# Patient Record
Sex: Male | Born: 1955 | Race: White | Hispanic: No | Marital: Single | Smoking: Former smoker
Health system: Southern US, Community
[De-identification: ages and names within clinical notes are randomized; demographics above are authoritative.]

---

## 2020-03-12 ENCOUNTER — Inpatient Hospital Stay (HOSPITAL_COMMUNITY)
Admission: EM | Admit: 2020-03-12 | Discharge: 2020-03-28 | DRG: 208 | Disposition: E | Payer: Medicaid Other | Attending: Internal Medicine | Admitting: Internal Medicine

## 2020-03-12 ENCOUNTER — Other Ambulatory Visit (HOSPITAL_COMMUNITY): Payer: Self-pay

## 2020-03-12 ENCOUNTER — Encounter (HOSPITAL_COMMUNITY): Payer: Self-pay | Admitting: Emergency Medicine

## 2020-03-12 ENCOUNTER — Other Ambulatory Visit: Payer: Self-pay

## 2020-03-12 ENCOUNTER — Observation Stay (HOSPITAL_COMMUNITY): Payer: Medicaid Other

## 2020-03-12 ENCOUNTER — Emergency Department (HOSPITAL_COMMUNITY): Payer: Medicaid Other

## 2020-03-12 DIAGNOSIS — R7303 Prediabetes: Secondary | ICD-10-CM | POA: Diagnosis present

## 2020-03-12 DIAGNOSIS — U071 COVID-19: Principal | ICD-10-CM | POA: Diagnosis present

## 2020-03-12 DIAGNOSIS — E871 Hypo-osmolality and hyponatremia: Secondary | ICD-10-CM | POA: Diagnosis present

## 2020-03-12 DIAGNOSIS — N179 Acute kidney failure, unspecified: Secondary | ICD-10-CM

## 2020-03-12 DIAGNOSIS — N170 Acute kidney failure with tubular necrosis: Secondary | ICD-10-CM | POA: Diagnosis present

## 2020-03-12 DIAGNOSIS — Z66 Do not resuscitate: Secondary | ICD-10-CM | POA: Diagnosis not present

## 2020-03-12 DIAGNOSIS — Z95828 Presence of other vascular implants and grafts: Secondary | ICD-10-CM

## 2020-03-12 DIAGNOSIS — R6521 Severe sepsis with septic shock: Secondary | ICD-10-CM | POA: Diagnosis not present

## 2020-03-12 DIAGNOSIS — E872 Acidosis: Secondary | ICD-10-CM | POA: Diagnosis not present

## 2020-03-12 DIAGNOSIS — Z515 Encounter for palliative care: Secondary | ICD-10-CM

## 2020-03-12 DIAGNOSIS — Z6830 Body mass index (BMI) 30.0-30.9, adult: Secondary | ICD-10-CM

## 2020-03-12 DIAGNOSIS — I10 Essential (primary) hypertension: Secondary | ICD-10-CM | POA: Diagnosis present

## 2020-03-12 DIAGNOSIS — G9341 Metabolic encephalopathy: Secondary | ICD-10-CM | POA: Diagnosis present

## 2020-03-12 DIAGNOSIS — I82462 Acute embolism and thrombosis of left calf muscular vein: Secondary | ICD-10-CM | POA: Diagnosis not present

## 2020-03-12 DIAGNOSIS — N1330 Unspecified hydronephrosis: Secondary | ICD-10-CM | POA: Diagnosis present

## 2020-03-12 DIAGNOSIS — F1010 Alcohol abuse, uncomplicated: Secondary | ICD-10-CM | POA: Diagnosis present

## 2020-03-12 DIAGNOSIS — R7402 Elevation of levels of lactic acid dehydrogenase (LDH): Secondary | ICD-10-CM | POA: Diagnosis present

## 2020-03-12 DIAGNOSIS — Z781 Physical restraint status: Secondary | ICD-10-CM

## 2020-03-12 DIAGNOSIS — Z789 Other specified health status: Secondary | ICD-10-CM

## 2020-03-12 DIAGNOSIS — C7951 Secondary malignant neoplasm of bone: Secondary | ICD-10-CM | POA: Diagnosis present

## 2020-03-12 DIAGNOSIS — R918 Other nonspecific abnormal finding of lung field: Secondary | ICD-10-CM | POA: Diagnosis present

## 2020-03-12 DIAGNOSIS — D638 Anemia in other chronic diseases classified elsewhere: Secondary | ICD-10-CM | POA: Diagnosis present

## 2020-03-12 DIAGNOSIS — J1282 Pneumonia due to coronavirus disease 2019: Secondary | ICD-10-CM | POA: Diagnosis present

## 2020-03-12 DIAGNOSIS — E669 Obesity, unspecified: Secondary | ICD-10-CM | POA: Diagnosis present

## 2020-03-12 DIAGNOSIS — I824Z9 Acute embolism and thrombosis of unspecified deep veins of unspecified distal lower extremity: Secondary | ICD-10-CM

## 2020-03-12 DIAGNOSIS — J9620 Acute and chronic respiratory failure, unspecified whether with hypoxia or hypercapnia: Secondary | ICD-10-CM

## 2020-03-12 DIAGNOSIS — Z87891 Personal history of nicotine dependence: Secondary | ICD-10-CM

## 2020-03-12 DIAGNOSIS — E870 Hyperosmolality and hypernatremia: Secondary | ICD-10-CM | POA: Diagnosis not present

## 2020-03-12 DIAGNOSIS — S37019A Minor contusion of unspecified kidney, initial encounter: Secondary | ICD-10-CM

## 2020-03-12 DIAGNOSIS — I700 Atherosclerosis of aorta: Secondary | ICD-10-CM | POA: Diagnosis present

## 2020-03-12 DIAGNOSIS — Z825 Family history of asthma and other chronic lower respiratory diseases: Secondary | ICD-10-CM

## 2020-03-12 DIAGNOSIS — J9601 Acute respiratory failure with hypoxia: Secondary | ICD-10-CM | POA: Diagnosis not present

## 2020-03-12 DIAGNOSIS — J80 Acute respiratory distress syndrome: Secondary | ICD-10-CM

## 2020-03-12 DIAGNOSIS — R0602 Shortness of breath: Secondary | ICD-10-CM

## 2020-03-12 DIAGNOSIS — A4189 Other specified sepsis: Secondary | ICD-10-CM | POA: Diagnosis not present

## 2020-03-12 DIAGNOSIS — K5720 Diverticulitis of large intestine with perforation and abscess without bleeding: Secondary | ICD-10-CM | POA: Diagnosis present

## 2020-03-12 DIAGNOSIS — Z4659 Encounter for fitting and adjustment of other gastrointestinal appliance and device: Secondary | ICD-10-CM

## 2020-03-12 DIAGNOSIS — Z978 Presence of other specified devices: Secondary | ICD-10-CM

## 2020-03-12 DIAGNOSIS — E875 Hyperkalemia: Secondary | ICD-10-CM | POA: Diagnosis not present

## 2020-03-12 DIAGNOSIS — C641 Malignant neoplasm of right kidney, except renal pelvis: Secondary | ICD-10-CM | POA: Diagnosis present

## 2020-03-12 DIAGNOSIS — I472 Ventricular tachycardia: Secondary | ICD-10-CM | POA: Diagnosis not present

## 2020-03-12 DIAGNOSIS — R0902 Hypoxemia: Secondary | ICD-10-CM

## 2020-03-12 DIAGNOSIS — Z452 Encounter for adjustment and management of vascular access device: Secondary | ICD-10-CM

## 2020-03-12 DIAGNOSIS — R310 Gross hematuria: Secondary | ICD-10-CM | POA: Diagnosis present

## 2020-03-12 LAB — BASIC METABOLIC PANEL
Anion gap: 14 (ref 5–15)
BUN: 76 mg/dL — ABNORMAL HIGH (ref 8–23)
CO2: 22 mmol/L (ref 22–32)
Calcium: 8.2 mg/dL — ABNORMAL LOW (ref 8.9–10.3)
Chloride: 100 mmol/L (ref 98–111)
Creatinine, Ser: 3.77 mg/dL — ABNORMAL HIGH (ref 0.61–1.24)
GFR, Estimated: 17 mL/min — ABNORMAL LOW (ref >60)
Glucose, Bld: 139 mg/dL — ABNORMAL HIGH (ref 70–99)
Potassium: 4.2 mmol/L (ref 3.5–5.1)
Sodium: 136 mmol/L (ref 135–145)

## 2020-03-12 LAB — COMPREHENSIVE METABOLIC PANEL
ALT: 25 U/L (ref 0–44)
AST: 39 U/L (ref 15–41)
Albumin: 2.7 g/dL — ABNORMAL LOW (ref 3.5–5.0)
Alkaline Phosphatase: 100 U/L (ref 38–126)
Anion gap: 19 — ABNORMAL HIGH (ref 5–15)
BUN: 79 mg/dL — ABNORMAL HIGH (ref 8–23)
CO2: 19 mmol/L — ABNORMAL LOW (ref 22–32)
Calcium: 8.3 mg/dL — ABNORMAL LOW (ref 8.9–10.3)
Chloride: 95 mmol/L — ABNORMAL LOW (ref 98–111)
Creatinine, Ser: 4.69 mg/dL — ABNORMAL HIGH (ref 0.61–1.24)
GFR, Estimated: 13 mL/min — ABNORMAL LOW (ref >60)
Glucose, Bld: 143 mg/dL — ABNORMAL HIGH (ref 70–99)
Potassium: 3.6 mmol/L (ref 3.5–5.1)
Sodium: 133 mmol/L — ABNORMAL LOW (ref 135–145)
Total Bilirubin: 1.6 mg/dL — ABNORMAL HIGH (ref 0.3–1.2)
Total Protein: 6.6 g/dL (ref 6.5–8.1)

## 2020-03-12 LAB — CBC WITH DIFFERENTIAL/PLATELET
Abs Immature Granulocytes: 0 10*3/uL (ref 0.00–0.07)
Basophils Absolute: 0 10*3/uL (ref 0.0–0.1)
Basophils Relative: 0 %
Eosinophils Absolute: 0 10*3/uL (ref 0.0–0.5)
Eosinophils Relative: 0 %
HCT: 38.9 % — ABNORMAL LOW (ref 39.0–52.0)
Hemoglobin: 12.8 g/dL — ABNORMAL LOW (ref 13.0–17.0)
Lymphocytes Relative: 5 %
Lymphs Abs: 0.4 10*3/uL — ABNORMAL LOW (ref 0.7–4.0)
MCH: 30 pg (ref 26.0–34.0)
MCHC: 32.9 g/dL (ref 30.0–36.0)
MCV: 91.1 fL (ref 80.0–100.0)
Monocytes Absolute: 0.2 10*3/uL (ref 0.1–1.0)
Monocytes Relative: 3 %
Neutro Abs: 6.6 10*3/uL (ref 1.7–7.7)
Neutrophils Relative %: 92 %
Platelets: 310 10*3/uL (ref 150–400)
RBC: 4.27 MIL/uL (ref 4.22–5.81)
RDW: 14.6 % (ref 11.5–15.5)
WBC: 7.2 10*3/uL (ref 4.0–10.5)
nRBC: 0.4 % — ABNORMAL HIGH (ref 0.0–0.2)
nRBC: 2 /100 WBC — ABNORMAL HIGH

## 2020-03-12 LAB — I-STAT ARTERIAL BLOOD GAS, ED
Acid-base deficit: 1 mmol/L (ref 0.0–2.0)
Bicarbonate: 22.1 mmol/L (ref 20.0–28.0)
Calcium, Ion: 1.11 mmol/L — ABNORMAL LOW (ref 1.15–1.40)
HCT: 34 % — ABNORMAL LOW (ref 39.0–52.0)
Hemoglobin: 11.6 g/dL — ABNORMAL LOW (ref 13.0–17.0)
O2 Saturation: 86 %
Potassium: 3.6 mmol/L (ref 3.5–5.1)
Sodium: 136 mmol/L (ref 135–145)
TCO2: 23 mmol/L (ref 22–32)
pCO2 arterial: 30.9 mmHg — ABNORMAL LOW (ref 32.0–48.0)
pH, Arterial: 7.464 — ABNORMAL HIGH (ref 7.350–7.450)
pO2, Arterial: 48 mmHg — ABNORMAL LOW (ref 83.0–108.0)

## 2020-03-12 LAB — C-REACTIVE PROTEIN: CRP: 15.4 mg/dL — ABNORMAL HIGH (ref <1.0)

## 2020-03-12 LAB — URINALYSIS, ROUTINE W REFLEX MICROSCOPIC
Bilirubin Urine: NEGATIVE
Glucose, UA: NEGATIVE mg/dL
Ketones, ur: NEGATIVE mg/dL
Leukocytes,Ua: NEGATIVE
Nitrite: NEGATIVE
Protein, ur: 30 mg/dL — AB
RBC / HPF: >50 RBC/hpf — ABNORMAL HIGH (ref 0–5)
Specific Gravity, Urine: 1.013 (ref 1.005–1.030)
pH: 5 (ref 5.0–8.0)

## 2020-03-12 LAB — RESP PANEL BY RT-PCR (FLU A&B, COVID) ARPGX2
Influenza A by PCR: NEGATIVE
Influenza B by PCR: NEGATIVE
SARS Coronavirus 2 by RT PCR: POSITIVE — AB

## 2020-03-12 LAB — LACTIC ACID, PLASMA
Lactic Acid, Venous: 2.2 mmol/L (ref 0.5–1.9)
Lactic Acid, Venous: 1.7 mmol/L (ref 0.5–1.9)

## 2020-03-12 LAB — TRIGLYCERIDES: Triglycerides: 315 mg/dL — ABNORMAL HIGH (ref <150)

## 2020-03-12 LAB — SODIUM, URINE, RANDOM: Sodium, Ur: 42 mmol/L

## 2020-03-12 LAB — PROTEIN / CREATININE RATIO, URINE
Creatinine, Urine: 128.44 mg/dL
Protein Creatinine Ratio: 0.6 mg/mg{Cre} — ABNORMAL HIGH (ref 0.00–0.15)
Total Protein, Urine: 77 mg/dL

## 2020-03-12 LAB — PROCALCITONIN: Procalcitonin: 1.5 ng/mL

## 2020-03-12 LAB — HIV ANTIBODY (ROUTINE TESTING W REFLEX): HIV Screen 4th Generation wRfx: NONREACTIVE

## 2020-03-12 LAB — FERRITIN: Ferritin: 958 ng/mL — ABNORMAL HIGH (ref 24–336)

## 2020-03-12 LAB — FIBRINOGEN: Fibrinogen: 720 mg/dL — ABNORMAL HIGH (ref 210–475)

## 2020-03-12 LAB — LACTATE DEHYDROGENASE: LDH: 457 U/L — ABNORMAL HIGH (ref 98–192)

## 2020-03-12 LAB — CREATININE, URINE, RANDOM: Creatinine, Urine: 129.37 mg/dL

## 2020-03-12 LAB — D-DIMER, QUANTITATIVE: D-Dimer, Quant: 2.76 ug/mL-FEU — ABNORMAL HIGH (ref 0.00–0.50)

## 2020-03-12 MED ORDER — LACTATED RINGERS IV SOLN: INTRAVENOUS | Status: DC

## 2020-03-12 MED ORDER — DEXAMETHASONE SODIUM PHOSPHATE 10 MG/ML IJ SOLN
10.0000 mg | Freq: Once | INTRAMUSCULAR | Status: AC
  Administered 2020-03-12: 15:00:00 10 mg via INTRAVENOUS
  Filled 2020-03-12: qty 1

## 2020-03-12 MED ORDER — HYDROCOD POLST-CPM POLST ER 10-8 MG/5ML PO SUER: 5.0000 mL | Freq: Two times a day (BID) | ORAL | Status: DC | PRN

## 2020-03-12 MED ORDER — GUAIFENESIN-DM 100-10 MG/5ML PO SYRP: 10.0000 mL | ORAL_SOLUTION | ORAL | Status: DC | PRN

## 2020-03-12 MED ORDER — SODIUM CHLORIDE 0.9% FLUSH
3.0000 mL | Freq: Two times a day (BID) | INTRAVENOUS | Status: DC
  Administered 2020-03-12 – 2020-03-20 (×14): 3 mL via INTRAVENOUS

## 2020-03-12 MED ORDER — POLYETHYLENE GLYCOL 3350 17 G PO PACK
17.0000 g | PACK | Freq: Every day | ORAL | Status: DC | PRN
Start: 2020-03-12 — End: 2020-03-18

## 2020-03-12 MED ORDER — SODIUM CHLORIDE 0.9 % IV SOLN
1.0000 g | INTRAVENOUS | Status: AC
  Administered 2020-03-12 – 2020-03-16 (×5): 1 g via INTRAVENOUS
  Filled 2020-03-12 (×6): qty 10

## 2020-03-12 MED ORDER — LACTATED RINGERS IV BOLUS
1000.0000 mL | Freq: Once | INTRAVENOUS | Status: AC
  Administered 2020-03-12: 18:00:00 1000 mL via INTRAVENOUS

## 2020-03-12 MED ORDER — ACETAMINOPHEN 325 MG PO TABS: 650.0000 mg | ORAL_TABLET | Freq: Four times a day (QID) | ORAL | Status: DC | PRN

## 2020-03-12 MED ORDER — HEPARIN SODIUM (PORCINE) 5000 UNIT/ML IJ SOLN
5000.0000 [IU] | Freq: Three times a day (TID) | INTRAMUSCULAR | Status: DC
  Administered 2020-03-12 – 2020-03-13 (×2): 5000 [IU] via SUBCUTANEOUS
  Filled 2020-03-12: qty 1

## 2020-03-12 MED ORDER — LACTATED RINGERS IV BOLUS
1000.0000 mL | Freq: Once | INTRAVENOUS | Status: AC
  Administered 2020-03-12: 15:00:00 1000 mL via INTRAVENOUS

## 2020-03-12 MED ORDER — AZITHROMYCIN 500 MG IV SOLR
500.0000 mg | INTRAVENOUS | Status: AC
  Administered 2020-03-12 – 2020-03-16 (×5): 500 mg via INTRAVENOUS
  Filled 2020-03-12 (×6): qty 500

## 2020-03-12 MED ORDER — BARICITINIB 2 MG PO TABS: 4.0000 mg | ORAL_TABLET | Freq: Every day | ORAL | Status: DC

## 2020-03-12 MED ORDER — DEXAMETHASONE 6 MG PO TABS
6.0000 mg | ORAL_TABLET | ORAL | Status: DC
  Filled 2020-03-12: qty 1

## 2020-03-12 MED ORDER — IPRATROPIUM-ALBUTEROL 20-100 MCG/ACT IN AERS
1.0000 | INHALATION_SPRAY | Freq: Four times a day (QID) | RESPIRATORY_TRACT | Status: DC
  Administered 2020-03-12 – 2020-03-16 (×14): 1 via RESPIRATORY_TRACT
  Filled 2020-03-12 (×2): qty 4

## 2020-03-12 NOTE — TOC Initial Note (Addendum)
Transition of Care [facility]) - Initial/Assessment Note    Patient Details  Name: Philip Stafford MRN: [id number] Date of Birth: 1955/08/26  Transition of Care [facility]) CM/SW Contact:    Verdell Carmine, RN Phone Number: 03/16/2020, 4:33 PM  Clinical Narrative:                 64 year old uninsured in with hypoxia due to the ongoing COVID 19 pandemic. Has some confusion, lives alone, girlfriend has been checking on him,has been sick for 10 days. On high flow, 60L 100% weaning in the ED as tolerated, May need ICU, will see how patient weans. May need HH likely will need Oxygen at home.  Will check with patient regarding paying source and if needs MATCH for medications, and charity items. CM will follow for needs. EDD > 3 days. Will need a FU appointment at [facility] made with assistance in finding PCP.   Expected Discharge Plan: [place] Barriers to Discharge: Continued Medical Work up   Patient Goals and CMS Choice        Expected Discharge Plan and Services Expected Discharge Plan: [place]   Discharge Planning Services: CM Consult   Living arrangements for the past 2 months: Single Family Home                                      Prior Living Arrangements/Services Living arrangements for the past 2 months: Single Family Home Lives with:: Self Patient language and need for interpreter reviewed:: Yes        Need for Family Participation in Patient Care: Yes (Comment) Care giver support system in place?: Yes (comment)   Criminal Activity/Legal Involvement Pertinent to Current Situation/Hospitalization: No - Comment as needed  Activities of Daily Living      Permission Sought/Granted                  Emotional Assessment       Orientation: : Fluctuating Orientation (Suspected and/or reported Sundowners) Alcohol / Substance Use: Not Applicable Psych Involvement: No (comment)  Admission diagnosis:  Pneumonia due to  COVID-19 virus [U07.1, J12.82] Patient Active Problem List   Diagnosis Date Noted  . Pneumonia due to COVID-19 virus 03/27/2020   PCP:  No primary care provider on file. Pharmacy:   [id number] - [place], [address] [place] [address] Phone: [phone] Fax: [phone]     Social Determinants of Health (SDOH) Interventions    Readmission Risk Interventions No flowsheet data found.

## 2020-03-12 NOTE — ED Triage Notes (Signed)
Pt arrives to triage, appearing pale and altered, unable to provide much history, O2 sats with good pleth 38% in triage. Pt does state he thinks he had a covid + test on 12/7 but unable to provide any other information at this time.

## 2020-03-12 NOTE — ED Notes (Signed)
Philip Stafford ([place]) [phone]

## 2020-03-12 NOTE — ED Notes (Signed)
Bladder scan 575ml. Patient attempting to urinate in urinal.

## 2020-03-12 NOTE — ED Notes (Signed)
Date and time results received: 03/05/2020 (use smartphrase ".now" to insert current time)  Test: lactic Critical Value: 2.2 Name of Provider Notified: [place]

## 2020-03-12 NOTE — ED Provider Notes (Signed)
[facility] EMERGENCY DEPARTMENT Provider Note   CSN: [id number] Arrival date & time: 03/21/2020  1308     History Chief Complaint  Patient presents with  . Covid Positive  . low O2 sats    Philip Stafford is a 64 y.o. male.  Patient is a 64 year old male with no significant past medical history presenting today with Covid-like symptoms.  Patient reports he started feeling sick 10 days ago where he had cough, congestion and general malaise.  He has been gradually worsening and reports he feels okay but his girlfriend has been checking on him his oxygen saturation was decreasing at home.  He denies any chest pain or abdominal pain.  Sometimes when asked a question he seems to have difficulty answering.  He does not know if he has been running fever.  He did not receive the vaccine.  He denies any recent nausea or or vomiting but has had decreased appetite.  He denies alcohol or tobacco use.  He does not use inhalers at home.  The history is provided by the patient.       History reviewed. No pertinent past medical history.  There are no problems to display for this patient.   History reviewed. No pertinent surgical history.     No family history on file.     Home Medications Prior to Admission medications   Not on File    Allergies    Patient has no known allergies.  Review of Systems   Review of Systems  All other systems reviewed and are negative.   Physical Exam Updated Vital Signs BP 132/83   Pulse 74   Temp 97.7 F (36.5 C) (Oral)   Resp (!) 22   SpO2 95%   Physical Exam Vitals and nursing note reviewed.  Constitutional:      General: He is not in acute distress.    Appearance: He is well-developed, normal weight and well-nourished.  HENT:     Head: Normocephalic and atraumatic.     Mouth/Throat:     Mouth: Oropharynx is clear and moist. Mucous membranes are dry.  Eyes:     Extraocular Movements: EOM normal.      Conjunctiva/sclera: Conjunctivae normal.     Pupils: Pupils are equal, round, and reactive to light.  Cardiovascular:     Rate and Rhythm: Normal rate and regular rhythm.     Pulses: Intact distal pulses.     Heart sounds: No murmur heard.   Pulmonary:     Effort: Pulmonary effort is normal. No respiratory distress.     Breath sounds: Rales present. No wheezing.     Comments: Fine crackles present throughout lung fields Abdominal:     General: There is no distension.     Palpations: Abdomen is soft.     Tenderness: There is no abdominal tenderness. There is no guarding or rebound.  Musculoskeletal:        General: No tenderness or edema. Normal range of motion.     Cervical back: Normal range of motion and neck supple.     Right lower leg: No edema.     Left lower leg: No edema.  Skin:    General: Skin is warm and dry.     Findings: No erythema or rash.  Neurological:     Mental Status: He is alert and oriented to person, place, and time.  Psychiatric:        Mood and Affect: Mood and affect normal.  Behavior: Behavior normal.     ED Results / Procedures / Treatments   Labs (all labs ordered are listed, but only abnormal results are displayed) Labs Reviewed  LACTIC ACID, PLASMA - Abnormal; Notable for the following components:      Result Value   Lactic Acid, Venous 2.2 (*)    All other components within normal limits  CBC WITH DIFFERENTIAL/PLATELET - Abnormal; Notable for the following components:   Hemoglobin 12.8 (*)    HCT 38.9 (*)    nRBC 0.4 (*)    All other components within normal limits  COMPREHENSIVE METABOLIC PANEL - Abnormal; Notable for the following components:   Sodium 133 (*)    Chloride 95 (*)    CO2 19 (*)    Glucose, Bld 143 (*)    BUN 79 (*)    Creatinine, Ser 4.69 (*)    Calcium 8.3 (*)    Albumin 2.7 (*)    Total Bilirubin 1.6 (*)    GFR, Estimated 13 (*)    Anion gap 19 (*)    All other components within normal limits  D-DIMER,  QUANTITATIVE (NOT AT [facility]) - Abnormal; Notable for the following components:   D-Dimer, Quant 2.76 (*)    All other components within normal limits  LACTATE DEHYDROGENASE - Abnormal; Notable for the following components:   LDH 457 (*)    All other components within normal limits  TRIGLYCERIDES - Abnormal; Notable for the following components:   Triglycerides 315 (*)    All other components within normal limits  FIBRINOGEN - Abnormal; Notable for the following components:   Fibrinogen 720 (*)    All other components within normal limits  RESP PANEL BY RT-PCR (FLU A&B, COVID) ARPGX2  CULTURE, BLOOD (ROUTINE X 2)  CULTURE, BLOOD (ROUTINE X 2)  LACTIC ACID, PLASMA  PROCALCITONIN  FERRITIN  C-REACTIVE PROTEIN    EKG EKG Interpretation  Date/Time:  Thursday March 12 2020 13:22:06 EST Ventricular Rate:  74 PR Interval:    QRS Duration: 102 QT Interval:  429 QTC Calculation: 476 R Axis:   -71 Text Interpretation: Sinus rhythm Probable left atrial enlargement RSR' in V1 or V2, probably normal variant Inferior infarct, old No previous tracing Confirmed by Blanchie Dessert [phone]) on 03/10/2020 2:23:50 PM   Radiology DG Chest Port 1 View  Result Date: 02/28/2020 CLINICAL DATA:  Shortness of breath. EXAM: PORTABLE CHEST 1 VIEW COMPARISON:  None. FINDINGS: The heart size and mediastinal contours are within normal limits. Diffuse interstitial thickening. More ill-defined perihilar airspace opacity in the left upper lobe. No pleural effusion or pneumothorax. No acute osseous abnormality. IMPRESSION: 1. Left upper lobe airspace disease concerning for pneumonia. 2. Bilateral diffuse interstitial thickening may represent interstitial edema versus interstitial pneumonitis secondary to an infectious or inflammatory etiology. Electronically Signed   By: Titus Dubin M.D.   On: 02/28/2020 13:53    Procedures Procedures (including critical care time)  Medications Ordered in  ED Medications - No data to display  ED Course  I have reviewed the triage vital signs and the nursing notes.  Pertinent labs & imaging results that were available during my care of the patient were reviewed by me and considered in my medical decision making (see chart for details).    MDM Rules/Calculators/A&P                          Patient presenting with symptoms today most classic for Covid pneumonia.  He is hypoxic but otherwise in no acute distress.  Patient does appear mildly confused and has sometimes difficulty answering questions.  Initially on evaluation to triage oxygen saturation was 38% however patient is currently on a nonrebreather and O2 sats are 95%.  He denies any chest pain or abdominal pain low suspicion for PE at this time.  Patient has not received the vaccine and denies any medication allergies.  He does not take any medications regularly.  Blood pressure and heart rate are stable.  Preadmission Covid labs in process.  2:50 PM Patient's lactate is elevated at 2.2 and he has a new acute kidney injury with creatinine of 4.69 and anion gap of 19.  BUN is elevated at 79 as well and suspect prerenal azotemia.  D-dimer is elevated 2.76, LDH elevated at 457 and also elevated fibrinogen and triglycerides.  Chest x-ray is consistent with Covid pneumonia.  Patient given a liter of fluid.  Patient remains on a nonrebreather and saturations in the high 80s.  Will switch to high flow nasal cannula.  Will admit for further care.  MDM Number of Diagnoses or Management Options   Amount and/or Complexity of Data Reviewed Clinical lab tests: ordered and reviewed Tests in the radiology section of CPT: ordered and reviewed Tests in the medicine section of CPT: ordered and reviewed Decide to obtain previous medical records or to obtain history from someone other than the patient: yes Obtain history from someone other than the patient: yes Discuss the patient with other providers:  yes Independent visualization of images, tracings, or specimens: yes  Risk of Complications, Morbidity, and/or Mortality Presenting problems: high Diagnostic procedures: moderate Management options: moderate  Patient Progress Patient progress: stable  CRITICAL CARE Performed by: Zachary Lovins Total critical care time: 30 minutes Critical care time was exclusive of separately billable procedures and treating other patients. Critical care was necessary to treat or prevent imminent or life-threatening deterioration. Critical care was time spent personally by me on the following activities: development of treatment plan with patient and/or surrogate as well as nursing, discussions with consultants, evaluation of patient's response to treatment, examination of patient, obtaining history from patient or surrogate, ordering and performing treatments and interventions, ordering and review of laboratory studies, ordering and review of radiographic studies, pulse oximetry and re-evaluation of patient's condition.  Philip Stafford was evaluated in Emergency Department on 03/05/2020 for the symptoms described in the history of present illness. He was evaluated in the context of the global COVID-19 pandemic, which necessitated consideration that the patient might be at risk for infection with the SARS-CoV-2 virus that causes COVID-19. Institutional protocols and algorithms that pertain to the evaluation of patients at risk for COVID-19 are in a state of rapid change based on information released by regulatory bodies including the CDC and federal and state organizations. These policies and algorithms were followed during the patient's care in the ED.  Final Clinical Impression(s) / ED Diagnoses Final diagnoses:  Pneumonia due to COVID-19 virus  Acute respiratory failure with hypoxia ([place])  AKI (acute kidney injury) [facility])    Rx / DC Orders ED Discharge Orders    None       Blanchie Dessert,  MD 03/27/2020 1452

## 2020-03-12 NOTE — H&P (Signed)
Date: 03/21/2020               Patient Name:  Philip Stafford MRN: [id number]  DOB: 18-Jan-1956 Age / Sex: 64 y.o., male   PCP: Patient, No Pcp Per         Medical Service: Internal Medicine Teaching Service         Attending Physician: Dr. Velna Ochs    First Contact: Dr. Konrad Penta Pager: [phone]  Second Contact: Dr. Charleen Kirks Pager: [phone]       After Hours (After 5p/  First Contact Pager: [phone]  weekends / holidays): Second Contact Pager: [phone]   Chief Complaint: Fever  History of Present Illness:   Philip Stafford is a 64 y.o. gentleman with no known PMHx who presented to the ED with c/o fever.   Philip Stafford states his first symptom was fever, approximately 102.1 occurred approximately 10 days ago.  In this time, he had also developed productive cough with occasionally small bloody streaks in his sputum.  He denies any difficulty breathing though.  He is also experiencing nausea when eating with approximately 1-2 episodes of nonbilious, nonbloody emesis.  He endorses diaphoresis, dizziness, blurry vision, generalized weakness, fatigue that all started since he became sick, but denies any chest pain, palpitations, abdominal pain, dysuria, hematuria, urinary frequency or urgency.  He endorses some back pain but feels it is muscular.  He notes that he came to the ED as his girlfriend has been checking his oxygen saturations at home and he was noted to be D satting.  He went to an urgent care on the December 15th, where he was prescribed azithromycin, decadron, tessalon pearls, hydroxyurea )for GI upset) and folic acid.   ED Course:  On arrival to the ED private car, initial oxygen saturation noted in triage was 38% that improved to 95% on nonrebreather.  Patient was afebrile at 97.7, normotensive at 132/83, heart rate of 74 with a respiratory rate of 22.  Initial CMP remarkable for acute renal failure with creatinine of 4.69, BUN of 79.  He also has a anion gap acidosis with  CO2 of 19 and anion gap of 19, mild hyponatremia at 133, mildly elevated total bilirubin of 1.6.  CBC shows a mild normocytic anemia with hemoglobin of 12.8.  No leukocytosis. Inflammatory markers are elevated with LDH of 457, ferritin of 958, CRP of 15, fibrinogen of 720, D-dimer of 2.76.  Initial chest x-ray with left upper lobe airspace disease concerning for pneumonia, as well as bilateral diffuse interstitial thickening.  Covid test was obtained and positive.  Home Medications: None   Allergies: Allergies as of 03/21/2020  . (No Known Allergies)   History reviewed. No pertinent past medical history.  Family History: Father: COPD Brother: COPD  Mother: Heart disease, passed in 91  Social History:  Lives in [place], [place] Works as a Dealer  Former tobacco use. 2 packs per day for 35 years. Quit 11 years ago Denies current alcohol use. Endorses previously heavy use, quit 11 years ago Denies current drug use. Previously smoked marijuana.   Went to prison in 2010 for 2 years. Since then, he has not used any tobacco, alcohol or drug products.   Review of Systems: A complete ROS was negative except as per HPI.   Physical Exam: Blood pressure (!) 133/92, pulse 73, temperature 97.7 F (36.5 C), temperature source Oral, resp. rate 20, SpO2 91 %.  Physical Exam Vitals and nursing note reviewed.  Constitutional:  General: He is not in acute distress.    Appearance: He is obese. He is ill-appearing. He is not diaphoretic.  Eyes:     General: Scleral icterus present.  Cardiovascular:     Rate and Rhythm: Normal rate and regular rhythm.     Heart sounds: No murmur heard. No gallop.   Pulmonary:     Effort: Tachypnea present. No accessory muscle usage or respiratory distress.     Breath sounds: Rales (Inspiratory Rales noted in all lung fields) present. No decreased breath sounds, wheezing or rhonchi.  Abdominal:     General: Bowel sounds are normal. There is distension  (Possibly due to body habitus).     Palpations: Abdomen is soft. There is no mass.     Tenderness: There is no abdominal tenderness. There is no right CVA tenderness, left CVA tenderness or guarding.  Musculoskeletal:     Right lower leg: No edema.     Left lower leg: No edema.  Skin:    General: Skin is warm and dry.     Coloration: Skin is not jaundiced.  Neurological:     General: No focal deficit present.     Mental Status: He is alert and oriented to person, place, and time.     Comments: Answer some questions slowly, however he is oriented x3 and answers all questions appropriately  Psychiatric:        Mood and Affect: Mood normal.        Behavior: Behavior normal.    EKG: personally reviewed my interpretation is NSR at 74bpm. No significant T wave inversions or ST segment elevations or depressions to suggest ischemia.   CXR: personally reviewed my interpretation is diffuse, bilateral interstitial edema, worse in the LUL and RML.    Assessment & Plan by Problem: Active Problems:   Pneumonia due to COVID-19 virus  # COVID-19 Pneumonia  Patient reports approximately 10 days of symptoms with initial positive test today.  Due to this, he is out of the window for remdesivir and unfortunately his renal failure limits the use of baricitinib.  Current COVID-19 management with Decadron only. Given an opacity localized to the LUL, procalcitonin checked and elevated at 1.50. Will start empiric treatment for CAP as well.   He is at have very high risk of decompensation given currently saturating between 90 to 92% on 40 L high flow nasal cannula with 100% FiO2; easily desats with any motion and takes a while to recover.   - Supplemental O2 to maintain oxygen saturation > 90% - Decadron 10 mg received - Start Decadron 6 mg daily tomorrow for 9 days to complete 10 day course - Consider Baricitinib should renal function recover - Ceftriaxone 1 g IV daily  - Azithromycin 500 mg IV daily  -  ABG pending - Lactic acid pending  - Trend inflammatory markers daily  - Low threshold to contact PCCM for possible transfer to ICU  # Acute Renal Failure  Chronicity unknown but suspect an aspect of CKD given profoundly elevated creatinine and BUN.  I suspect that there is a portion of this that is prerenal given acute illness for the past 10 days.  Renal ultrasound obtained that showed irregular density along the periphery of the right kidney which may represent a subcapsular hematoma, infection or neoplasm.  Non-conrast CT recommended and ordered.   No profound encephalopathy at this time, although patient is slow to answer questions.  ABG still pending, however CO2 is mildly low at 19.  No hyperkalemia, profound acidosis or volume overload to suggest emergent need for dialysis.   Bladder scan at ~ 500 mL. Will in/out PRN.   - Consider consult to Nephrology  - Bladder scan q8h  - In/out cath PRN  - Urinalysis  - Urine sodium, creatinine, protein/creatinine ratio  - CT abdomen/pelvis WO contrast  - s/p 1 L of LR. Additional 1 L followed by 125 cc/hr maintenance fluids   # Anion Gap Metabolic Acidosis  Anion gap of 19 with bicarb of 19.  Initial lactic acid only mildly elevated, so likely secondary to uremia.    - Repeat BMP pending   # Hyperglycemia  This may be elevated in the setting of acute illness versus underlying undiagnosed diabetes   - A1c pending    Dispo: Admit patient to Observation with expected length of stay less than 2 midnights.  Signed: Dr. Jose Persia Internal Medicine PGY-2  Pager: [phone] After 5pm on weekdays and 1pm on weekends: On Call pager [phone]  03/20/2020, 5:56 PM

## 2020-03-12 NOTE — Progress Notes (Signed)
Patient placed on heated high flow per MD. Patient on 40L and 100 % with sats of 89%. RT will wean oxygen.

## 2020-03-12 NOTE — Consult Note (Signed)
NAME:  Philip Stafford, MRN:  [id number], DOB:  1956/01/31, LOS: 0 ADMISSION DATE:  03/27/2020, CONSULTATION DATE:  03/17/2020 REFERRING MD:  Charleen Kirks, CHIEF COMPLAINT:  Hypoxia   Brief History:  64 year old male with history of tobacco use and minimal interaction with the healthcare system who presented to the ED with fever and shortness of breath.  Found to be Covid positive and was requiring high flow nasal cannula with concern for worsening respiratory deterioration.    History of Present Illness:  Philip Stafford is a 64 year old male who does not follow with primary care and has a history of 70 pack years of tobacco use  (quit 11 yrs ago) who presents with fever up to 102F at home.  He had been diagnosed with Covid-19 approximately 4 days ago.  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  He has had some cough and emesis.  He was prescribed azithromycin by urgent care.  On arrival to the ED his oxygen saturations were very low, 38% though patient was awake so unlikely to be accurate.  He was placed on nonrebreather.  Work-up was significant for likely left upper lobe pneumonia and diffuse interstitial thickening with elevated inflammatory markers and a creatinine of 4.6.  Oxygen requirements increased to 40 L with PO2 of 48 on ABG, so PCCM was consulted.  On evaluation, reportedly patient's oxygen tank had run out at the time of a prior ABG.  He is currently awake and alert on 15 L nonrebreather without any respiratory distress or complaints.  Past Medical History:  Tobacco use  Significant Hospital Events:  12/16 admit to internal medicine  Consults:  PCCM  Procedures:    Significant Diagnostic Tests:    Micro Data:  12/16 Covid-19>> positive  Antimicrobials:   azithromycin 12/16- Ceftriaxone 12/16-  Interim History / Subjective:  As above  Objective   Blood pressure (!) 147/90, pulse 71, temperature 97.7 F (36.5 C), temperature source  Oral, resp. rate (!) 23, SpO2 95 %.    FiO2 (%):  [100 %] 100 %   Intake/Output Summary (Last 24 hours) at 03/16/2020 2055 Last data filed at 03/06/2020 2041 Gross per 24 hour  Intake 2000 ml  Output 700 ml  Net 1300 ml   There were no vitals filed for this visit.  General: Elderly male, resting in bed, no distress HEENT: MM pink/moist Neuro: Awake, alert and oriented, following commands CV: s1s2 RRR, no m/r/g PULM: No tachypnea or accessory muscle use, mild crackles bilateral bases without wheezing, saturations > 95% on 15 L GI: soft, bsx4 active  Extremities: warm/dry,  No edema  Skin: no rashes or lesions   Resolved Hospital Problem list     Assessment & Plan:   Acute hypoxic respiratory failure secondary to Covid-19 pneumonia Patient had worsening hypoxia earlier, improved with changing out oxygen tank.  She really at high risk of decompensation giving significant history of tobacco use and unvaccinated status.  But currently stable and does not require BiPAP or ICU care P -Agree with primary team's plan of supplemental oxygen as needed to maintain saturations greater than 88% -Steroids, azithromycin and ceftriaxone, consider baricitinib -Please reengage PCCM for any concerns of worsening respiratory failure or overall clinical status    Goals of Care:  Last date of multidisciplinary goals of care discussion: N/A Family and staff present: N/A Summary of discussion: N/A Follow up goals of care discussion due: N/A Code Status: Full code  Labs  CBC: Recent Labs  Lab 03/16/2020 1336 03/25/2020 1831  WBC 7.2  --   NEUTROABS 6.6  --   HGB 12.8* 11.6*  HCT 38.9* 34.0*  MCV 91.1  --   PLT 310  --     Basic Metabolic Panel: Recent Labs  Lab 03/11/2020 1336 03/09/2020 1831  NA 133* 136  K 3.6 3.6  CL 95*  --   CO2 19*  --   GLUCOSE 143*  --   BUN 79*  --   CREATININE 4.69*  --   CALCIUM 8.3*  --    GFR: CrCl cannot be calculated (Unknown ideal  weight.). Recent Labs  Lab 03/09/2020 1336 03/15/2020 1636  PROCALCITON 1.50  --   WBC 7.2  --   LATICACIDVEN 2.2* 1.7    Liver Function Tests: Recent Labs  Lab 03/17/2020 1336  AST 39  ALT 25  ALKPHOS 100  BILITOT 1.6*  PROT 6.6  ALBUMIN 2.7*   No results for input(s): LIPASE, AMYLASE in the last 168 hours. No results for input(s): AMMONIA in the last 168 hours.  ABG    Component Value Date/Time   PHART 7.464 (H) 03/23/2020 1831   PCO2ART 30.9 (L) 03/08/2020 1831   PO2ART 48 (L)  1831   HCO3 22.1 03/16/2020 1831   TCO2 23 03/21/2020 1831   ACIDBASEDEF 1.0 02/29/2020 1831   O2SAT 86.0 03/04/2020 1831     Coagulation Profile: No results for input(s): INR, PROTIME in the last 168 hours.  Cardiac Enzymes: No results for input(s): CKTOTAL, CKMB, CKMBINDEX, TROPONINI in the last 168 hours.  HbA1C: No results found for: HGBA1C  CBG: No results for input(s): GLUCAP in the last 168 hours.  Review of Systems:   Negative except as noted in HPI  Past Medical History:  He,  has no past medical history on file.   Surgical History:  History reviewed. No pertinent surgical history.   Social History:    Denies alcohol use or drug use  Family History:  His family history is not on file.   Allergies No Known Allergies   Home Medications  Prior to Admission medications   Medication Sig Start Date End Date Taking? Authorizing Provider  azithromycin (ZITHROMAX) 250 MG tablet Take 250-500 mg by mouth as directed. Take 500mg  on day one, then 250mg  on days 2 through 5.   Yes [provider]  benzonatate (TESSALON) 100 MG capsule Take 100 mg by mouth 3 (three) times daily.   Yes [provider]  dexamethasone (DECADRON) 2 MG tablet Take 2-4 mg by mouth See admin instructions. Take 4mg  daily for 3 days, then 2mg  daily for 2 days.   Yes [provider]  folic acid (FOLVITE) 1 MG tablet Take 2 mg by mouth daily. Take for 5 days.   Yes  [provider]  hydroxyurea (HYDREA) 500 MG capsule Take 500 mg by mouth 2 (two) times daily. Take for 5 days. May take with food to minimize GI side effects.   Yes [provider]     Critical care time: 35 minutes    CRITICAL CARE Performed by: Otilio Carpen Keats Kingry   Total critical care time: 35 minutes  Critical care time was exclusive of separately billable procedures and treating other patients.  Critical care was necessary to treat or prevent imminent or life-threatening deterioration.  Critical care was time spent personally by me on the following activities: development of treatment plan with patient and/or surrogate as well as nursing,  discussions with consultants, evaluation of patient's response to treatment, examination of patient, obtaining history from patient or surrogate, ordering and performing treatments and interventions, ordering and review of laboratory studies, ordering and review of radiographic studies, pulse oximetry and re-evaluation of patient's condition.   Otilio Carpen Nataki Mccrumb, PA-C [place] [facility]  Pager# [phone], if no answer [phone]

## 2020-03-13 ENCOUNTER — Observation Stay (HOSPITAL_COMMUNITY): Payer: Medicaid Other

## 2020-03-13 ENCOUNTER — Observation Stay (HOSPITAL_BASED_OUTPATIENT_CLINIC_OR_DEPARTMENT_OTHER): Payer: Medicaid Other

## 2020-03-13 DIAGNOSIS — E669 Obesity, unspecified: Secondary | ICD-10-CM | POA: Diagnosis present

## 2020-03-13 DIAGNOSIS — R310 Gross hematuria: Secondary | ICD-10-CM | POA: Diagnosis present

## 2020-03-13 DIAGNOSIS — R6521 Severe sepsis with septic shock: Secondary | ICD-10-CM | POA: Diagnosis not present

## 2020-03-13 DIAGNOSIS — J80 Acute respiratory distress syndrome: Secondary | ICD-10-CM | POA: Diagnosis present

## 2020-03-13 DIAGNOSIS — N1330 Unspecified hydronephrosis: Secondary | ICD-10-CM | POA: Diagnosis present

## 2020-03-13 DIAGNOSIS — C7951 Secondary malignant neoplasm of bone: Secondary | ICD-10-CM | POA: Diagnosis present

## 2020-03-13 DIAGNOSIS — G9340 Encephalopathy, unspecified: Secondary | ICD-10-CM | POA: Diagnosis not present

## 2020-03-13 DIAGNOSIS — N179 Acute kidney failure, unspecified: Secondary | ICD-10-CM | POA: Diagnosis not present

## 2020-03-13 DIAGNOSIS — J1282 Pneumonia due to coronavirus disease 2019: Secondary | ICD-10-CM | POA: Diagnosis present

## 2020-03-13 DIAGNOSIS — E871 Hypo-osmolality and hyponatremia: Secondary | ICD-10-CM | POA: Diagnosis present

## 2020-03-13 DIAGNOSIS — Z452 Encounter for adjustment and management of vascular access device: Secondary | ICD-10-CM | POA: Diagnosis not present

## 2020-03-13 DIAGNOSIS — S37019A Minor contusion of unspecified kidney, initial encounter: Secondary | ICD-10-CM

## 2020-03-13 DIAGNOSIS — D638 Anemia in other chronic diseases classified elsewhere: Secondary | ICD-10-CM | POA: Diagnosis present

## 2020-03-13 DIAGNOSIS — R7989 Other specified abnormal findings of blood chemistry: Secondary | ICD-10-CM | POA: Diagnosis not present

## 2020-03-13 DIAGNOSIS — E870 Hyperosmolality and hypernatremia: Secondary | ICD-10-CM | POA: Diagnosis not present

## 2020-03-13 DIAGNOSIS — S37011A Minor contusion of right kidney, initial encounter: Secondary | ICD-10-CM | POA: Diagnosis not present

## 2020-03-13 DIAGNOSIS — U071 COVID-19: Secondary | ICD-10-CM | POA: Diagnosis present

## 2020-03-13 DIAGNOSIS — J9601 Acute respiratory failure with hypoxia: Secondary | ICD-10-CM | POA: Diagnosis present

## 2020-03-13 DIAGNOSIS — J9621 Acute and chronic respiratory failure with hypoxia: Secondary | ICD-10-CM | POA: Diagnosis not present

## 2020-03-13 DIAGNOSIS — I82462 Acute embolism and thrombosis of left calf muscular vein: Secondary | ICD-10-CM | POA: Diagnosis not present

## 2020-03-13 DIAGNOSIS — Z515 Encounter for palliative care: Secondary | ICD-10-CM | POA: Diagnosis not present

## 2020-03-13 DIAGNOSIS — Z66 Do not resuscitate: Secondary | ICD-10-CM | POA: Diagnosis not present

## 2020-03-13 DIAGNOSIS — I824Z9 Acute embolism and thrombosis of unspecified deep veins of unspecified distal lower extremity: Secondary | ICD-10-CM | POA: Diagnosis not present

## 2020-03-13 DIAGNOSIS — I700 Atherosclerosis of aorta: Secondary | ICD-10-CM | POA: Diagnosis present

## 2020-03-13 DIAGNOSIS — N170 Acute kidney failure with tubular necrosis: Secondary | ICD-10-CM | POA: Diagnosis present

## 2020-03-13 DIAGNOSIS — K5720 Diverticulitis of large intestine with perforation and abscess without bleeding: Secondary | ICD-10-CM | POA: Diagnosis present

## 2020-03-13 DIAGNOSIS — R918 Other nonspecific abnormal finding of lung field: Secondary | ICD-10-CM | POA: Diagnosis present

## 2020-03-13 DIAGNOSIS — R7402 Elevation of levels of lactic acid dehydrogenase (LDH): Secondary | ICD-10-CM | POA: Diagnosis present

## 2020-03-13 DIAGNOSIS — A4189 Other specified sepsis: Secondary | ICD-10-CM | POA: Diagnosis not present

## 2020-03-13 DIAGNOSIS — I82402 Acute embolism and thrombosis of unspecified deep veins of left lower extremity: Secondary | ICD-10-CM | POA: Diagnosis not present

## 2020-03-13 DIAGNOSIS — E872 Acidosis: Secondary | ICD-10-CM | POA: Diagnosis not present

## 2020-03-13 DIAGNOSIS — G9341 Metabolic encephalopathy: Secondary | ICD-10-CM | POA: Diagnosis present

## 2020-03-13 DIAGNOSIS — J9620 Acute and chronic respiratory failure, unspecified whether with hypoxia or hypercapnia: Secondary | ICD-10-CM | POA: Diagnosis not present

## 2020-03-13 DIAGNOSIS — C641 Malignant neoplasm of right kidney, except renal pelvis: Secondary | ICD-10-CM | POA: Diagnosis present

## 2020-03-13 DIAGNOSIS — R7303 Prediabetes: Secondary | ICD-10-CM | POA: Diagnosis present

## 2020-03-13 LAB — CBC WITH DIFFERENTIAL/PLATELET
Abs Immature Granulocytes: 0 10*3/uL (ref 0.00–0.07)
Basophils Absolute: 0 10*3/uL (ref 0.0–0.1)
Basophils Relative: 0 %
Eosinophils Absolute: 0 10*3/uL (ref 0.0–0.5)
Eosinophils Relative: 0 %
HCT: 34.5 % — ABNORMAL LOW (ref 39.0–52.0)
Hemoglobin: 11.6 g/dL — ABNORMAL LOW (ref 13.0–17.0)
Lymphocytes Relative: 4 %
Lymphs Abs: 0.4 10*3/uL — ABNORMAL LOW (ref 0.7–4.0)
MCH: 30.2 pg (ref 26.0–34.0)
MCHC: 33.6 g/dL (ref 30.0–36.0)
MCV: 89.8 fL (ref 80.0–100.0)
Monocytes Absolute: 0.4 10*3/uL (ref 0.1–1.0)
Monocytes Relative: 4 %
Neutro Abs: 10.2 10*3/uL — ABNORMAL HIGH (ref 1.7–7.7)
Neutrophils Relative %: 92 %
Platelets: 335 10*3/uL (ref 150–400)
RBC: 3.84 MIL/uL — ABNORMAL LOW (ref 4.22–5.81)
RDW: 14.8 % (ref 11.5–15.5)
WBC: 11.1 10*3/uL — ABNORMAL HIGH (ref 4.0–10.5)
nRBC: 0.4 % — ABNORMAL HIGH (ref 0.0–0.2)
nRBC: 1 /100 WBC — ABNORMAL HIGH

## 2020-03-13 LAB — COMPREHENSIVE METABOLIC PANEL
ALT: 22 U/L (ref 0–44)
AST: 36 U/L (ref 15–41)
Albumin: 2.4 g/dL — ABNORMAL LOW (ref 3.5–5.0)
Alkaline Phosphatase: 85 U/L (ref 38–126)
Anion gap: 16 — ABNORMAL HIGH (ref 5–15)
BUN: 75 mg/dL — ABNORMAL HIGH (ref 8–23)
CO2: 21 mmol/L — ABNORMAL LOW (ref 22–32)
Calcium: 8.4 mg/dL — ABNORMAL LOW (ref 8.9–10.3)
Chloride: 102 mmol/L (ref 98–111)
Creatinine, Ser: 3.27 mg/dL — ABNORMAL HIGH (ref 0.61–1.24)
GFR, Estimated: 20 mL/min — ABNORMAL LOW (ref >60)
Glucose, Bld: 173 mg/dL — ABNORMAL HIGH (ref 70–99)
Potassium: 3.8 mmol/L (ref 3.5–5.1)
Sodium: 139 mmol/L (ref 135–145)
Total Bilirubin: 0.8 mg/dL (ref 0.3–1.2)
Total Protein: 5.8 g/dL — ABNORMAL LOW (ref 6.5–8.1)

## 2020-03-13 LAB — BLOOD GAS, ARTERIAL
Acid-base deficit: 2 mmol/L (ref 0.0–2.0)
Bicarbonate: 21.6 mmol/L (ref 20.0–28.0)
Drawn by: 535211
FIO2: 100
O2 Saturation: 82 %
Patient temperature: 36.7
pCO2 arterial: 32.7 mmHg (ref 32.0–48.0)
pH, Arterial: 7.435 (ref 7.350–7.450)
pO2, Arterial: 48.2 mmHg — ABNORMAL LOW (ref 83.0–108.0)

## 2020-03-13 LAB — ECHOCARDIOGRAM LIMITED
Area-P 1/2: 4.89 cm2
Calc EF: 62.5 %
S' Lateral: 3.4 cm
Single Plane A2C EF: 61.3 %
Single Plane A4C EF: 64 %

## 2020-03-13 LAB — D-DIMER, QUANTITATIVE: D-Dimer, Quant: 3.91 ug/mL-FEU — ABNORMAL HIGH (ref 0.00–0.50)

## 2020-03-13 LAB — GLUCOSE, CAPILLARY: Glucose-Capillary: 166 mg/dL — ABNORMAL HIGH (ref 70–99)

## 2020-03-13 LAB — HEMOGLOBIN A1C
Hgb A1c MFr Bld: 5.9 % — ABNORMAL HIGH (ref 4.8–5.6)
Mean Plasma Glucose: 122.63 mg/dL

## 2020-03-13 LAB — FERRITIN: Ferritin: 1157 ng/mL — ABNORMAL HIGH (ref 24–336)

## 2020-03-13 LAB — C-REACTIVE PROTEIN: CRP: 7.1 mg/dL — ABNORMAL HIGH (ref <1.0)

## 2020-03-13 MED ORDER — DEXAMETHASONE SODIUM PHOSPHATE 10 MG/ML IJ SOLN
6.0000 mg | INTRAMUSCULAR | Status: DC
  Administered 2020-03-13 – 2020-03-20 (×8): 6 mg via INTRAVENOUS
  Filled 2020-03-13 (×8): qty 1

## 2020-03-13 MED ORDER — CHLORHEXIDINE GLUCONATE CLOTH 2 % EX PADS
6.0000 | MEDICATED_PAD | Freq: Every day | CUTANEOUS | Status: DC
  Administered 2020-03-13 – 2020-03-21 (×9): 6 via TOPICAL

## 2020-03-13 NOTE — Progress Notes (Signed)
RT NOTE: RT arrived to patient room and patient was sating 88-90% on 15L [place] and NRB. RT placed patient on 20L HHFNC per MD order and patients sats would not go above 85%. RT added NRB with HHFNC and sats came up to 88%. RT increased HHFNC to 25L and sats are now 90%-92%. Patient states that his breathing feels comfortable on HHFNC and NRB. RN aware and at bedside.  RT will continue to monitor.

## 2020-03-13 NOTE — Consult Note (Signed)
Reason for Consult: Acute kidney injury, right perinephric collection with mass-effect Referring Physician: Velna Ochs MD (MTS)  HPI:  64 year old man with unremarkable past medical history/no regular medical follow-up who presented to the emergency room with complaints of a 10-day history of generalized weakness/fatigue, fever along with productive cough and occasional hemoptysis.  He was found to have intermittent oxygen desaturation on home pulse ox monitoring and upon arrival to the emergency room he was noted to be significantly hypoxic at 38% improving to 95% on NRB.  He was consequently found to be positive for COVID-19 infection and is unvaccinated.  He reports about 2 episodes of nausea and vomiting prior to his admission but denies any diarrhea or abdominal pain.  He denies any chest pain or pedal edema.  Concern raised with his renal function on admission with an elevated creatinine of 4.7 that has improved to 3.3 with ongoing supportive management.  He had a renal ultrasound showing an irregular density at the periphery of the right kidney with differential was raised for subcapsular hematoma, infected collection versus neoplasm and this was followed up with CT scan of the abdomen/pelvis without contrast showing an asymmetrically right enlarged kidney with moderate to large perinephric collection suggestive of hematoma (evaluation limited by lack of contrast).  Enlargement is associated with what appears to be mass-effect on the parenchyma of the kidney and mild hydronephrosis.  Urinalysis done yesterday showed pH of 5.0, 30 protein, rare bacteria, 0-5 WBC/hpf with >50 RBC/hpf. FeNa 1.1%.  Urine PC ratio 0.6.  He denies any dysuria, urgency, frequency, flank pain, fever, chills or hematuria prior to admission.  History reviewed. No pertinent past medical history.  He denies any past history of kidney disease and does not have any strong family history of renal disease or  personal/familial history of autoimmune disorders such as lupus.  History reviewed. No pertinent surgical history.  History reviewed. No pertinent family history.  Social History:  reports that he has quit smoking. He has never used smokeless tobacco. No history on file for alcohol use and drug use.  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  He denies any ongoing tobacco use or illicit drug use.  Allergies: No Known Allergies  Medications:  Scheduled: . Chlorhexidine Gluconate Cloth  6 each Topical Daily  . dexamethasone  6 mg Oral Q24H  . Ipratropium-Albuterol  1 puff Inhalation Q6H  . sodium chloride flush  3 mL Intravenous Q12H    BMP Latest Ref Rng & Units 03/13/2020 03/01/2020 03/15/2020  Glucose 70 - 99 mg/dL 173(H) 139(H) -  BUN 8 - 23 mg/dL 75(H) 76(H) -  Creatinine 0.61 - 1.24 mg/dL 3.27(H) 3.77(H) -  Sodium 135 - 145 mmol/L 139 136 136  Potassium 3.5 - 5.1 mmol/L 3.8 4.2 3.6  Chloride 98 - 111 mmol/L 102 100 -  CO2 22 - 32 mmol/L 21(L) 22 -  Calcium 8.9 - 10.3 mg/dL 8.4(L) 8.2(L) -   CBC Latest Ref Rng & Units 03/13/2020 03/07/2020 03/14/2020  WBC 4.0 - 10.5 K/uL 11.1(H) - 7.2  Hemoglobin 13.0 - 17.0 g/dL 11.6(L) 11.6(L) 12.8(L)  Hematocrit 39.0 - 52.0 % 34.5(L) 34.0(L) 38.9(L)  Platelets 150 - 400 K/uL 335 - 310     CT ABDOMEN PELVIS WO CONTRAST  Addendum Date: 03/06/2020   ADDENDUM REPORT: 03/17/2020 21:51 ADDENDUM: Other differential for right perinephric collection as it aerated in the previously performed ultrasound. Ultimately a contrast enhanced examination may be required for further evaluation when clinically feasible.  Electronically Signed   By: Donavan Foil M.D.   On: 02/27/2020 21:51   Result Date: 03/11/2020 CLINICAL DATA:  Renal failure new acute renal failure EXAM: CT ABDOMEN AND PELVIS WITHOUT CONTRAST TECHNIQUE: Multidetector CT imaging of the abdomen and pelvis was performed following the standard protocol without IV contrast.  COMPARISON:  Ultrasound 03/16/2020 FINDINGS: Lower chest: Lung bases demonstrate extensive ground-glass density. 12 mm left lower lobe lung nodule, series 5, image number 5. 9 mm right lower lobe lung nodule, series 5, image number 9. No pleural effusion. Normal cardiac size Hepatobiliary: No focal liver abnormality is seen. No gallstones, gallbladder wall thickening, or biliary dilatation. Pancreas: Unremarkable. No pancreatic ductal dilatation or surrounding inflammatory changes. Spleen: Normal in size without focal abnormality. Adrenals/Urinary Tract: Adrenal glands are normal. Left kidney shows no hydronephrosis. The bladder is unremarkable. Suspected slightly dense right perinephric collection, difficult to separate from renal parenchyma without contrast. Mild perinephric fat stranding. Suspect compression of right renal parenchyma. There also appears to be mild right hydronephrosis. Stomach/Bowel: Stomach is within normal limits. Appendix appears normal. No evidence of bowel wall thickening, distention, or inflammatory changes. Mild diverticular disease of the sigmoid colon without acute inflammatory change Vascular/Lymphatic: Advanced aortic atherosclerosis. No aneurysm. No suspicious nodes. Reproductive: Prostate is unremarkable. Other: Negative for free air or free fluid. Musculoskeletal: No acute or suspicious osseous abnormality IMPRESSION: 1. Asymmetrically enlarged right kidney, suspect moderate to large right perinephric collection. Collection appears slightly dense suggestive of perirenal hematoma though further characterisation is limited without contrast. There also appears to be mild right hydronephrosis. There is no ureteral stone. 2. Extensive ground-glass density at the bilateral lung bases, favored to represent pneumonia with other differential considerations including edema and hemorrhage. There are 2 pulmonary nodules at the bilateral lung bases measuring up to 12 mm on the left, nodules  could be infectious, inflammatory, or neoplastic in etiology. 3. Sigmoid colon diverticular disease without acute inflammatory change. 4. Aortic atherosclerosis. Aortic Atherosclerosis (ICD10-I70.0). Electronically Signed: By: Donavan Foil M.D. On: 03/02/2020 19:07   US RENAL  Result Date: 03/18/2020 CLINICAL DATA:  64 year old male with acute renal insufficiency. EXAM: RENAL / URINARY TRACT ULTRASOUND COMPLETE COMPARISON:  None. FINDINGS: Right Kidney: Renal measurements: 13.3 x 6.9 x 6.7 cm = volume: 320 mL. The right kidney is enlarged and heterogeneous. There is heterogeneous and irregular peripheral tissue with increased echogenicity compared to the renal parenchyma. There is associated mass effect and compression of the renal parenchyma. This may represent a subcapsular hematoma, an infected collection, or neoplasm. There is apparent loss of fat plane between the kidney and right lobe of the liver. An infiltrating renal mass with extension into the liver is not excluded. There is no hydronephrosis or shadowing calculus. Left Kidney: Renal measurements: 11.4 x 5.3 x 4.5 cm = volume: 143 ML. Echogenicity within normal limits. No mass or hydronephrosis visualized. Bladder: Appears normal for degree of bladder distention. Other: None. IMPRESSION: Irregular density along the periphery of the right kidney may represent a subcapsular hematoma, infected collection, or neoplasm. Ideally a contrast enhanced CT is recommended for better evaluation of the right kidney. However, given the history of acute renal insufficiency, a noncontrast CT may be performed for initial additional evaluation. Depending of the findings on the noncontrast CT, follow-up CT with IV contrast after improvement of renal function may be necessary. Electronically Signed   By: Anner Crete M.D.   On: 03/03/2020 17:56   DG CHEST PORT 1 VIEW  Result Date: 03/13/2020  CLINICAL DATA:  Decreased oxygen saturation.  COVID-19 positive EXAM:  PORTABLE CHEST 1 VIEW COMPARISON:  March 12, 2020 FINDINGS: There is ill-defined airspace opacity in the left upper lobe as well as in both mid and lower lung regions. Heart is upper normal in size with pulmonary vascularity normal. No adenopathy. No bone lesions. IMPRESSION: Widespread airspace opacity bilaterally, essentially stable, likely due to multifocal atypical organism pneumonia. There may be a degree of underlying fibrosis. Stable cardiac silhouette. Electronically Signed   By: Lowella Grip III M.D.   On: 03/13/2020 12:49   DG Chest Port 1 View  Result Date: 03/14/2020 CLINICAL DATA:  Shortness of breath. EXAM: PORTABLE CHEST 1 VIEW COMPARISON:  None. FINDINGS: The heart size and mediastinal contours are within normal limits. Diffuse interstitial thickening. More ill-defined perihilar airspace opacity in the left upper lobe. No pleural effusion or pneumothorax. No acute osseous abnormality. IMPRESSION: 1. Left upper lobe airspace disease concerning for pneumonia. 2. Bilateral diffuse interstitial thickening may represent interstitial edema versus interstitial pneumonitis secondary to an infectious or inflammatory etiology. Electronically Signed   By: Titus Dubin M.D.   On: 03/16/2020 13:53   ECHOCARDIOGRAM LIMITED  Result Date: 03/13/2020    ECHOCARDIOGRAM LIMITED REPORT   Patient Name:   Philip Stafford Date of Exam: 03/13/2020 Medical Rec #:  [id number]       Height:       67.0 in Accession #:    [id number]      Weight:       213.0 lb Date of Birth:  [birth year]        BSA:          2.077 m Patient Age:    79 years        BP:           117/73 mmHg Patient Gender: M               HR:           63 bpm. Exam Location:  Inpatient Procedure: Limited Echo, Cardiac Doppler and Color Doppler STAT ECHO Indications:    Dyspnea R06.00  History:        Patient has no prior history of Echocardiogram examinations.                 Risk Factors:Former Smoker.  Sonographer:    Vickie Epley RDCS  Referring Phys: [id number] Velna Ochs  Sonographer Comments: Covid positive. Original order was for bubble study. Patient does not have any IV access. IMPRESSIONS  1. Left ventricular ejection fraction, by estimation, is 60 to 65%. The left ventricle has normal function. The left ventricle has no regional wall motion abnormalities. Left ventricular diastolic parameters were normal.  2. Right ventricular systolic function is normal. The right ventricular size is normal.  3. The mitral valve is normal in structure. Trivial mitral valve regurgitation. No evidence of mitral stenosis.  4. The aortic valve is grossly normal. Aortic valve regurgitation is not visualized. No aortic stenosis is present.  5. The inferior vena cava is normal in size with greater than 50% respiratory variability, suggesting right atrial pressure of 3 mmHg. Comparison(s): No prior Echocardiogram. Conclusion(s)/Recommendation(s): Normal biventricular function without evidence of hemodynamically significant valvular heart disease. FINDINGS  Left Ventricle: Left ventricular ejection fraction, by estimation, is 60 to 65%. The left ventricle has normal function. The left ventricle has no regional wall motion abnormalities. The left ventricular internal cavity size was normal in size. There is  no left  ventricular hypertrophy. Left ventricular diastolic parameters were normal. Right Ventricle: The right ventricular size is normal. Right vetricular wall thickness was not well visualized. Right ventricular systolic function is normal. Left Atrium: Left atrial size was normal in size. Right Atrium: Right atrial size was normal in size. Pericardium: There is no evidence of pericardial effusion. Mitral Valve: The mitral valve is normal in structure. Trivial mitral valve regurgitation. No evidence of mitral valve stenosis. Tricuspid Valve: The tricuspid valve is grossly normal. Tricuspid valve regurgitation is not demonstrated. No evidence of tricuspid  stenosis. Aortic Valve: The aortic valve is grossly normal. Aortic valve regurgitation is not visualized. No aortic stenosis is present. Pulmonic Valve: The pulmonic valve was not well visualized. Pulmonic valve regurgitation is not visualized. No evidence of pulmonic stenosis. Aorta: The aortic root and ascending aorta are structurally normal, with no evidence of dilitation. Venous: The inferior vena cava is normal in size with greater than 50% respiratory variability, suggesting right atrial pressure of 3 mmHg. IAS/Shunts: The atrial septum is grossly normal. LEFT VENTRICLE PLAX 2D LVIDd:         5.30 cm      Diastology LVIDs:         3.40 cm      LV e' medial:    8.70 cm/s LV PW:         0.80 cm      LV E/e' medial:  10.4 LV IVS:        0.80 cm      LV e' lateral:   10.40 cm/s LVOT diam:     2.30 cm      LV E/e' lateral: 8.7 LV SV:         105 LV SV Index:   51 LVOT Area:     4.15 cm  LV Volumes (MOD) LV vol d, MOD A2C: 161.0 ml LV vol d, MOD A4C: 142.0 ml LV vol s, MOD A2C: 62.3 ml LV vol s, MOD A4C: 51.1 ml LV SV MOD A2C:     98.7 ml LV SV MOD A4C:     142.0 ml LV SV MOD BP:      96.3 ml RIGHT VENTRICLE RV S prime:     17.00 cm/s TAPSE (M-mode): 2.5 cm LEFT ATRIUM         Index LA diam:    4.00 cm 1.93 cm/m  AORTIC VALVE LVOT Vmax:   115.00 cm/s LVOT Vmean:  72.800 cm/s LVOT VTI:    0.253 m  AORTA Ao Root diam: 3.90 cm Ao Asc diam:  3.50 cm MITRAL VALVE MV Area (PHT): 4.89 cm    SHUNTS MV Decel Time: 155 msec    Systemic VTI:  0.25 m MV E velocity: 90.40 cm/s  Systemic Diam: 2.30 cm MV A velocity: 85.70 cm/s MV E/A ratio:  1.05 Buford Dresser MD Electronically signed by Buford Dresser MD Signature Date/Time: 03/13/2020/12:28:45 PM    Final     Review of Systems  Constitutional: Positive for appetite change, chills, fatigue and fever. Negative for diaphoresis.  HENT: Negative for ear pain, nosebleeds and sore throat.   Eyes: Negative for photophobia, pain and redness.  Respiratory:  Positive for cough and shortness of breath. Negative for chest tightness and wheezing.   Cardiovascular: Negative for chest pain and leg swelling.  Gastrointestinal: Positive for nausea and vomiting. Negative for abdominal pain, blood in stool and diarrhea.  Endocrine: Positive for cold intolerance. Negative for polydipsia and polyphagia.  Genitourinary: Negative for difficulty  urinating, dysuria, flank pain and urgency.  Musculoskeletal: Positive for myalgias. Negative for arthralgias.  Skin: Negative for pallor and rash.  Neurological: Negative for dizziness, light-headedness and headaches.   Blood pressure 127/78, pulse 63, temperature 97.8 F (36.6 C), temperature source Oral, resp. rate 19, height 5\' 7"  (1.702 m), weight 96.6 kg, SpO2 93 %. Physical Exam Vitals and nursing note reviewed.  Constitutional:      Appearance: Normal appearance. He is ill-appearing.  HENT:     Head: Normocephalic and atraumatic.     Comments: On oxygen via facemask as well as nasal cannula    Right Ear: External ear normal.     Left Ear: External ear normal.     Nose: Nose normal.     Mouth/Throat:     Mouth: Mucous membranes are dry.     Pharynx: Oropharynx is clear.  Eyes:     Extraocular Movements: Extraocular movements intact.     Pupils: Pupils are equal, round, and reactive to light.  Cardiovascular:     Rate and Rhythm: Normal rate and regular rhythm.     Heart sounds: No murmur heard.   Pulmonary:     Effort: Pulmonary effort is normal.     Breath sounds: Rales present. No wheezing.  Abdominal:     General: Abdomen is flat. There is no distension.     Palpations: Abdomen is soft. There is no mass.     Tenderness: There is no guarding.  Genitourinary:    Comments: Dark red urine Via Foley catheter Musculoskeletal:        General: Normal range of motion.     Cervical back: Normal range of motion and neck supple.     Right lower leg: No edema.     Left lower leg: No edema.  Skin:     General: Skin is warm and dry.     Coloration: Skin is not pale.  Neurological:     Mental Status: He is alert and oriented to person, place, and time.     Assessment/Plan: 1.  Acute kidney injury: Unknown renal baseline due to lack of reliable/regular medical follow-up in the past.  His acute kidney injury is likely hemodynamically mediated in the setting of his COVID-19/febrile illness with or without progression to ischemic ATN.  Given his improving renal function with intravenous fluids, plausibly favoring prerenal mechanism.  Difficult to comment on urine sediment in the setting of such profound hematuria but unlikely to be from acute GN/RPGN with improving renal function. Avoid nephrotoxic medications including NSAIDs and iodinated intravenous contrast exposure unless the latter is absolutely indicated.  Preferred narcotic agents for pain control are hydromorphone, fentanyl, and methadone. Morphine should not be used. Avoid Baclofen and avoid oral sodium phosphate and magnesium citrate based laxatives / bowel preps. Continue strict Input and Output monitoring. Will monitor the patient closely with you and intervene or adjust therapy as indicated by changes in clinical status/labs. 2.  Right perinephric mass/fluid collection: Based on the radiological findings and patient exam, this is highly suspicious for a perinephric hematoma of unclear mechanism in the setting of COVID-19 infection/cough.  This is unlikely to be a perinephric abscess based on the relative stability and lack of leukocytosis in this patient.  The presence of gross hematuria definitely raise suspicion for malignancy and I would recommend reimaging his abdomen/pelvis with contrasted CT scan once renal function improves or alternatively, MRI if continues to have significant renal dysfunction.  He did have some urine retention earlier  that prompted placement of a Foley catheter (suspecting an element of bladder outlet obstruction) and  we will await urology evaluation to decide on whether he needs continuous bladder irrigation. 3.  COVID-19 infection/pneumonia/acute hypoxic respiratory failure: He is on monotherapy with Decadron and ongoing oxygen supplementation.  He is out of the therapeutic window for remdesivir and renal failure at this time precludes Barcitnib.  He is on coverage for community-acquired pneumonia as well with ceftriaxone and azithromycin. 4.  Incidental pulmonary nodules: Suspect infectious etiology at this time and will need reevaluation with imaging after he recovers from acute illness possibly in 3 to 6 months.    Abdulkadir Emmanuel K. 03/13/2020, 1:36 PM

## 2020-03-13 NOTE — Progress Notes (Signed)
Subjective:   Mr. Mcgann oxygen saturations were in the 80's-low 90's on 15L HFNC. When we saw the patient, he had his oxygen off to try to eat breakfast, and desaturated into the 70's. He required 15L HFNC with NRB to maintain saturations. He does say he has felt confused since COVID onset. He endorses mild productive cough and denies feeling short of breath. Continues to endorse bilateral flank pain. Denies trouble urinating, dysuria, or hematuria, although bladder scans continue to show retention, requiring foley catheter placement.   Objective:  Vital signs in last 24 hours: Vitals:   03/13/20 0616 03/13/20 0619 03/13/20 0749 03/13/20 0848  BP:   117/73   Pulse: 76 75 68 63  Resp: 20  20   Temp:   98 F (36.7 C)   TempSrc:   Oral   SpO2: 91% 92% 91% 91%  Weight:      Height:       General: Patient appears ill, in mild acute distress.  Respiratory: Patient has diffuse inspiratory rales, is tachypneic, and requires 15L HFNC and NRB to maintain O2 saturations 86-92%, with increased work of breathing.  Cardiovascular: RRR. No murmurs, rubs or gallops. No LE edema.  Neurological: Patient is alert and oriented x 3, although with slowed mentation. Follows commands.  GU: No CVA tenderness.  Skin: Appears mildly jaundiced.   Assessment/Plan:  Active Problems:   Pneumonia due to COVID-19 virus  # COVID-19 Pneumonia  # Acute Hypoxic Respiratory Failure  Patient with 10 days of COVID symptoms tested positive 03/02/2020. Patient clinically appears ill, with increased work of breathing and mild encephalopathy. Remains on decadron only as he is outside window for Remdesivir and has acute renal failure. Desaturated to the 70's overnight, although was found not to have oxygen connected. Was requiring 15L HFNC and NRB during examination today to maintain saturations 86-92% and continued to desaturate with exertion. Repeat ABG today shows pH 7.435, low pO2 48.2, with FiO2 100.0, concerning for  ARDS; however, patient has not had ECHO.   - Will start heated high flow O2 with NRB to maintain O2 sat > 90% - Will obtain ECHO to r/o heart failure  - Repeat CXR  - PCCM re-consulted  - Decadron 10mg  received - Continue Decadron 6mg  (Day 2/10)  - Consider Baricitinib if renal function continues to improve  - Ceftriaxone 1g IV daily - Azithromycin 500mg  IV daily  - Trend inflammatory markers daily  - Continuous telemetry monitoring   # Improving Intrarenal AKI # Urinary Retention Creatinine continues to improve from 4.69 > 3.27, although BUN remains elevated. GFR 20. UA with large hemoglobin and >50 RBC's and microalbuminuria. Renal ultrasound showed an irregular density along the periphery of the right kidney. CT abdomen/pelvis without contrast showed an asymmetrically enlarged right kidney with moderate to large right perinephric collection, concerning for perirenal hematoma, with mild right hydronephrosis but no ureteral stone. Patient endorses bilateral flank pain, although has no CVA tenderness and denies trauma. Multiple bladder scans show retention, so foley catheter was placed. Most likely represents hematoma given acute COVID-19 infection with elevated inflammatory markers / D-dimer, although consider possibility of neoplasm given lung nodules noted on CT abdomen and smoking history.  - Monitor strict I&O on foley catheter  - Urology consulted - Nephrology consulted  - Continue to trend renal function - Avoid nephrotoxic agents  - Further workup with contrast-enhanced study likely indicated, pending improvement in renal function   # Normocytic Anemia   Hemoglobin dropped from  admission from 12.8 to 11.6, although likely in the setting of fluids. There is concern for urologic blood loss (hematoma / hematuria).   - Continue to monitor daily CBC's - Switched heparin to SCD's for DVT PPx    # Pre-diabetes Hemoglobin 5.9. CBG stable.   - Continue monitoring CBG's on steroids -  Outpatient follow up   # Aortic Atherosclerosis CT Abdomen/pelvis showed severe aortic atherosclerosis. Patient would benefit from ASA / statin therapy.   - Will hold off on ASA / Plavix until acute illness improves  # Incidental Pulmonary Nodules CT abdomen pelvis showed 2 pulmonary nodules at the bilateral lung bases up to 54mm on the L. May be infectious in the case of COVID pneumonia, although could be neoplastic.   - Recommend outpatient follow up  Prior to Admission Living Arrangement: Home Anticipated Discharge Location: Uncertain at this time Barriers to Discharge: Respiratory Failure, Renal Failure   Code status: Full Code DVT PPx: SCD's  IVF: LR 168mL/hr  Diet: NPO   Jeralyn Bennett, MD 03/13/2020, 11:51 AM Pager: [phone] After 5pm on weekdays and 1pm on weekends: On Call pager [phone]

## 2020-03-13 NOTE — Progress Notes (Signed)
°  Echocardiogram 2D Echocardiogram has been performed.  Michiel Cowboy 03/13/2020, 12:23 PM

## 2020-03-13 NOTE — Progress Notes (Signed)
Bilateral lower extremity venous duplex has been completed. Preliminary results can be found in CV Proc through chart review.  Results were given to the patient's nurse, Denyse Amass.  03/13/20 3:54 PM Philip Stafford RVT

## 2020-03-13 NOTE — Consult Note (Signed)
Urology Consult  Consulting MD: Dr. Joycelyn Man  CC: Right renal mass  HPI: This is a 64 year old male admitted yesterday for management of Covid. The patient developed fever and cough approximately 11 days ago. He has been managing his symptoms at home. Because of problems with oxygenation, he presented to the emergency room for further management.  The patient was found to have a creatinine of 4.69. He also had microscopic hematuria. He underwent CT of the abdomen and pelvis which showed a right renal mass, perhaps perinephric hematoma. There was mild right hydronephrosis. Left kidney appeared normal. No urolithiasis was noted.  Prior to hospitalization, the patient denies any gross hematuria or flank pain. He does have a history of cigarette smoking but stopped quite a few years ago. He was a 2 pack-a-day smoker for 35 years. He denies significant lower urinary tract symptomatology. He denies prior urologic consultation.  Urology is consulted for his renal mass.  PMH: History reviewed. No pertinent past medical history.  PSH: History reviewed. No pertinent surgical history.  Allergies: No Known Allergies  Medications: Medications Prior to Admission  Medication Sig Dispense Refill Last Dose  . azithromycin (ZITHROMAX) 250 MG tablet Take 250-500 mg by mouth as directed. Take 500mg  on day one, then 250mg  on days 2 through 5.   03/15/2020 at Unknown time  . benzonatate (TESSALON) 100 MG capsule Take 100 mg by mouth 3 (three) times daily.   02/28/2020 at Unknown time  . dexamethasone (DECADRON) 2 MG tablet Take 2-4 mg by mouth See admin instructions. Take 4mg  daily for 3 days, then 2mg  daily for 2 days.   03/02/2020 at Unknown time  . folic acid (FOLVITE) 1 MG tablet Take 2 mg by mouth daily. Take for 5 days.   03/04/2020 at Unknown time  . hydroxyurea (HYDREA) 500 MG capsule Take 500 mg by mouth 2 (two) times daily. Take for 5 days. May take with food to minimize GI side effects.    03/05/2020 at Unknown time     Social History: Social History   Socioeconomic History  . Marital status: Single    Spouse name: Not on file  . Number of children: Not on file  . Years of education: Not on file  . Highest education level: Not on file  Occupational History  . Not on file  Tobacco Use  . Smoking status: Former Research scientist (life sciences)  . Smokeless tobacco: Never Used  Substance and Sexual Activity  . Alcohol use: Not on file  . Drug use: Not on file  . Sexual activity: Not on file  Other Topics Concern  . Not on file  Social History Narrative  . Not on file   Social Determinants of Health   Financial Resource Strain: Not on file  Food Insecurity: Not on file  Transportation Needs: Not on file  Physical Activity: Not on file  Stress: Not on file  Social Connections: Not on file  Intimate Partner Violence: Not on file    Family History: History reviewed. No pertinent family history.  Review of Systems: Positive: Recent fever, cough, shortness of breath, some nausea, weakness Negative:   A further 10 point review of systems was negative except what is listed in the HPI.  Physical Exam: @VITALS2 @ General: No acute distress.  Awake. Head:  Normocephalic.  Atraumatic. ENT:  EOMI.  Mucous membranes moist Neck:  Supple.  No lymphadenopathy. CV:  Regular rate Pulmonary: Tachypneic Abdomen: Obese, no masses noted. Mild to moderate right upper quadrant tenderness. No  CVA tenderness.. Skin:  Normal turgor.  No visible rash. Extremity: No gross deformity of extremities.  Neurologic: Alert. Appropriate mood.   Studies:  Recent Labs    03/11/2020 1336 03/14/2020 1831 03/13/20 0725  HGB 12.8* 11.6* 11.6*  WBC 7.2  --  11.1*  PLT 310  --  335    Recent Labs    03/04/2020 2125 03/13/20 0725  NA 136 139  K 4.2 3.8  CL 100 102  CO2 22 21*  BUN 76* 75*  CREATININE 3.77* 3.27*  CALCIUM 8.2* 8.4*  GFRNONAA 17* 20*     No results for input(s): INR, APTT in the last  72 hours.  Invalid input(s): PT   Invalid input(s): ABG  I reviewed the patient's CT images as well as all relevant laboratories  Assessment: 1. Right renal mass, suggestive of perinephric hematoma, etiology unknown. There may be an underlying neoplasm that is not easily studied without contrast  2. Renal insufficiency with mild right hydronephrosis. I do not think that this is related to any renal obstruction that needs to be managed  3. Covid with multiple sequelae  Plan: 1. At this point, I do not think further urologic intervention is needed currently  2. I do agree with Dr. Posey Pronto that further imaging of the patient's kidneys is necessary. Unless he has significant hemoglobin drop, this is not needed in the acute phase. However, 6 to 8 weeks out, I would suggest a contrasted CT or MRI if his renal function allows for this  3. I will follow with you during his hospitalization and assure proper follow-up    [phone]

## 2020-03-13 NOTE — Consult Note (Signed)
NAME:  Philip Stafford, MRN:  [id number], DOB:  Aug 02, 1955, LOS: 0 ADMISSION DATE:  03/20/2020, CONSULTATION DATE:  03/13/20 REFERRING MD:  Charleen Kirks, CHIEF COMPLAINT:  Hypoxia   Brief History:  64 year old male with history of tobacco use and minimal interaction with the healthcare system who presented to the ED with fever and shortness of breath.  Found to be Covid positive and was requiring high flow nasal cannula with concern for worsening respiratory deterioration.    History of Present Illness:  Philip Stafford is a 64 year old male who does not follow with primary care and has a history of 70 pack years of tobacco use  (quit 11 yrs ago) who presents with fever up to 102F at home.  He had been diagnosed with Covid-19 approximately 4 days ago.  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  He has had some cough and emesis.  He was prescribed azithromycin by urgent care.  On arrival to the ED his oxygen saturations were very low, 38% though patient was awake so unlikely to be accurate.  He was placed on nonrebreather.  Work-up was significant for likely left upper lobe pneumonia and diffuse interstitial thickening with elevated inflammatory markers and a creatinine of 4.6.  Oxygen requirements increased to 40 L with PO2 of 48 on ABG, so PCCM was consulted.  On evaluation, reportedly patient's oxygen tank had run out at the time of a prior ABG.  He is currently awake and alert on 15 L nonrebreather without any respiratory distress or complaints.  Past Medical History:  Tobacco use  Significant Hospital Events:  12/16 admit to internal medicine 12/17 Re-consult PCCM  Consults:  PCCM  Procedures:    Significant Diagnostic Tests:  TTE pending LE dopplers venous ordered  Micro Data:  12/16 Covid-19>> positive  Antimicrobials:   azithromycin 12/16- Ceftriaxone 12/16-  Interim History / Subjective:  NAEON, Sats stable on high oxygen. ABG stable. Feels  breathing is the same, no worse no better. Attempted to broach [place], he was distant would not engage.   Objective   Blood pressure 117/73, pulse 63, temperature 98 F (36.7 C), temperature source Oral, resp. rate 20, height 5\' 7"  (1.702 m), weight 96.6 kg, SpO2 91 %.    FiO2 (%):  [100 %] 100 %   Intake/Output Summary (Last 24 hours) at 03/13/2020 1123 Last data filed at 03/13/2020 0400 Gross per 24 hour  Intake 3208.65 ml  Output 700 ml  Net 2508.65 ml   Filed Weights   03/13/20 0433  Weight: 96.6 kg    General: Elderly male, resting in bed, no distress HEENT: MM pink/moist Neuro: Awake, alert and oriented, following commands CV: s1s2 RRR, no m/r/g PULM: No tachypnea or accessory muscle use, mild crackles bilateral bases without wheezing, saturations > 90% HFNC and NRB GI: soft, bsx4 active  Extremities: warm/dry,  No edema  Skin: no rashes or lesions   Resolved Hospital Problem list     Assessment & Plan:   Acute hypoxic respiratory failure secondary to Covid-19 pneumonia  High risk of decompensation giving significant history of tobacco use and unvaccinated status.  But currently stable and does not require BiPAP or ICU care. Frankly, given age and renal failure, his prognosis is grim. If progresses to needing ventilator, mortality worsens.  Has interstitial infiltrates on CXR - likely all related to ARDS. Does not appear volume up, more dry on exam. PE considered, AKI precludes CT with contrast. Given  infiltrates on CXR, parenchymal disease related to COVID likely the sole contributor to respiratory failure, PE less likely.  P -Supplemental oxygen as needed to maintain saturations greater than 88% -Steroids, azithromycin and ceftriaxone, consider baricitinib renally adjusted vs tocilizumab -TTE -Bilateral LE dopplers to eval VTE - ordered  We will sign off. Please contact us with any questions, concerns, or if status were to worsen.  Goals of Care:  Attempted engage  12/17. He was distant. Asked his feelings on his care if condition were to worsen. He said he just hopes he gets better. Remains full code.   Labs   Reviewed and as per EMR  Critical care time: n/a    Lanier Clam, MD

## 2020-03-13 NOTE — ED Notes (Signed)
Attempted report to 5W. 

## 2020-03-14 LAB — COMPREHENSIVE METABOLIC PANEL
ALT: 22 U/L (ref 0–44)
AST: 37 U/L (ref 15–41)
Albumin: 2.5 g/dL — ABNORMAL LOW (ref 3.5–5.0)
Alkaline Phosphatase: 82 U/L (ref 38–126)
Anion gap: 11 (ref 5–15)
BUN: 68 mg/dL — ABNORMAL HIGH (ref 8–23)
CO2: 22 mmol/L (ref 22–32)
Calcium: 8.6 mg/dL — ABNORMAL LOW (ref 8.9–10.3)
Chloride: 109 mmol/L (ref 98–111)
Creatinine, Ser: 2.28 mg/dL — ABNORMAL HIGH (ref 0.61–1.24)
GFR, Estimated: 31 mL/min — ABNORMAL LOW (ref >60)
Glucose, Bld: 130 mg/dL — ABNORMAL HIGH (ref 70–99)
Potassium: 4.3 mmol/L (ref 3.5–5.1)
Sodium: 142 mmol/L (ref 135–145)
Total Bilirubin: 0.9 mg/dL (ref 0.3–1.2)
Total Protein: 6 g/dL — ABNORMAL LOW (ref 6.5–8.1)

## 2020-03-14 LAB — CBC WITH DIFFERENTIAL/PLATELET
Abs Immature Granulocytes: 0.22 10*3/uL — ABNORMAL HIGH (ref 0.00–0.07)
Basophils Absolute: 0 10*3/uL (ref 0.0–0.1)
Basophils Relative: 0 %
Eosinophils Absolute: 0 10*3/uL (ref 0.0–0.5)
Eosinophils Relative: 0 %
HCT: 34.8 % — ABNORMAL LOW (ref 39.0–52.0)
Hemoglobin: 12.1 g/dL — ABNORMAL LOW (ref 13.0–17.0)
Immature Granulocytes: 2 %
Lymphocytes Relative: 6 %
Lymphs Abs: 0.6 10*3/uL — ABNORMAL LOW (ref 0.7–4.0)
MCH: 31.3 pg (ref 26.0–34.0)
MCHC: 34.8 g/dL (ref 30.0–36.0)
MCV: 89.9 fL (ref 80.0–100.0)
Monocytes Absolute: 0.7 10*3/uL (ref 0.1–1.0)
Monocytes Relative: 7 %
Neutro Abs: 8.7 10*3/uL — ABNORMAL HIGH (ref 1.7–7.7)
Neutrophils Relative %: 85 %
Platelets: 289 10*3/uL (ref 150–400)
RBC: 3.87 MIL/uL — ABNORMAL LOW (ref 4.22–5.81)
RDW: 15 % (ref 11.5–15.5)
WBC: 10.2 10*3/uL (ref 4.0–10.5)
nRBC: 0.7 % — ABNORMAL HIGH (ref 0.0–0.2)

## 2020-03-14 LAB — FERRITIN: Ferritin: 1068 ng/mL — ABNORMAL HIGH (ref 24–336)

## 2020-03-14 LAB — C-REACTIVE PROTEIN: CRP: 3.2 mg/dL — ABNORMAL HIGH (ref <1.0)

## 2020-03-14 LAB — D-DIMER, QUANTITATIVE: D-Dimer, Quant: 11.91 ug/mL-FEU — ABNORMAL HIGH (ref 0.00–0.50)

## 2020-03-14 MED ORDER — ACETAMINOPHEN 650 MG RE SUPP: 650.0000 mg | RECTAL | Status: DC | PRN

## 2020-03-14 MED ORDER — BARICITINIB 1 MG PO TABS
2.0000 mg | ORAL_TABLET | Freq: Every day | ORAL | Status: DC
  Administered 2020-03-15 – 2020-03-18 (×3): 2 mg via ORAL
  Filled 2020-03-14: qty 1
  Filled 2020-03-14 (×2): qty 2
  Filled 2020-03-14: qty 1

## 2020-03-14 MED ORDER — LORAZEPAM 2 MG/ML IJ SOLN
0.5000 mg | Freq: Once | INTRAMUSCULAR | Status: AC | PRN
  Administered 2020-03-14: 23:00:00 0.5 mg via INTRAVENOUS
  Filled 2020-03-14: qty 1

## 2020-03-14 MED ORDER — POLYVINYL ALCOHOL 1.4 % OP SOLN
1.0000 [drp] | OPHTHALMIC | Status: DC | PRN
  Filled 2020-03-14: qty 15

## 2020-03-14 MED ORDER — ACETAMINOPHEN 325 MG PO TABS
650.0000 mg | ORAL_TABLET | Freq: Four times a day (QID) | ORAL | Status: DC | PRN
  Administered 2020-03-14 – 2020-03-15 (×5): 650 mg via ORAL
  Filled 2020-03-14 (×5): qty 2

## 2020-03-14 MED ORDER — BARICITINIB 2 MG PO TABS
4.0000 mg | ORAL_TABLET | Freq: Every day | ORAL | Status: DC
  Administered 2020-03-14: 09:00:00 4 mg via ORAL
  Filled 2020-03-14: qty 2

## 2020-03-14 MED ORDER — LORAZEPAM 2 MG/ML IJ SOLN: 0.5000 mg | Freq: Four times a day (QID) | INTRAMUSCULAR | Status: DC | PRN

## 2020-03-14 NOTE — Progress Notes (Signed)
Subjective:   No acute overnight events noted.  Mr. Hyder evaluated at bedside this morning. He states he feels better today and understands he is in hospital for COVID-19 pneumonia. He continues to deny any shortness of breath.   Mr. Savant notes that in the case that he becomes obtunded, he would want his daughter, Richmond Campbell, to make decisions on his behalf.  I called Acie this morning and updated her on patient's current condition, including that his respiratory status continues to be tenuous and he is at risk of requiring intubation.  She states that she would like for him to remain full code and understands that there is a high risk of deterioration.  She would like [place] to update either her, patient's sister, or patient's girlfriend moving forward, but she states for now she will do her best to relay information to the rest of the family.  All questions and concerns addressed.  Objective:  Vital signs in last 24 hours: Vitals:   03/13/20 1603 03/13/20 2120 03/14/20 0015 03/14/20 0400  BP: 125/75 130/86 137/83 129/79  Pulse: 60 65 70 (!) 56  Resp: 20 (!) 22 16 16   Temp: (!) 97.3 F (36.3 C) 98.2 F (36.8 C) 98.2 F (36.8 C) 98.3 F (36.8 C)  TempSrc: Axillary Axillary Axillary Oral  SpO2: 91% 92% 93% 92%  Weight:      Height:       Physical Exam Vitals and nursing note reviewed.  Constitutional:      Appearance: He is obese. He is ill-appearing.  Cardiovascular:     Rate and Rhythm: Normal rate and regular rhythm.     Comments: No murmurs Pulmonary:     Effort: Tachypnea, accessory muscle usage and respiratory distress (Mild) present.     Breath sounds: Rales (Throughout all lung fields) present. No rhonchi.  Abdominal:     General: Bowel sounds are normal. There is no distension.     Palpations: Abdomen is soft.     Tenderness: There is no abdominal tenderness. There is no guarding.  Neurological:     General: No focal deficit present.     Mental Status: He is alert  and oriented to person, place, and time.     Comments: Encephalopathy resolved at this time.  Psychiatric:        Mood and Affect: Mood normal.        Behavior: Behavior normal.    Assessment/Plan:  Active Problems:   Pneumonia due to COVID-19 virus   Acute renal failure (HCC)   Perinephric hematoma   ARDS (adult respiratory distress syndrome) ([place])  # COVID-19 Pneumonia  # ARDS  Per ABG yesterday, PaO2/ FiO2 ratio of 48 concerning for developing ARDS.  Echo was obtained to rule out any cardiac involvement and results were negative.  Mr. Ambler oxygen requirement continues to be significantly high, sustaining oxygen saturation of 90% with 40 L of heated high flow in 15 L of nonrebreather with FiO2 of 100%.  Any motion leads to drastic desaturations, which take a significant amount of time for recovery.  Patient still has some room in his heated high flow settings, so no current indication for transfer to the ICU per PCCM.  Given that patient's renal function improved, will start baricitinib with renal dosing.  - Supplemental oxygen with heated high flow O2 and NRB to maintain O2 sat > 90% - Continue Decadron 6mg  (Day 3/10)  - Start Baricitinib 2 g (Day 1/14) - Ceftriaxone 1g IV daily -  Azithromycin 500mg  IV daily  - Trend inflammatory markers daily  - Continuous telemetry monitoring   # DVT  Venous Doppler obtained yesterday positive for DVT in the left gastrocnemius vein.  Unfortunately patient cannot be anticoagulated at this time due to perirenal hematoma.  There is a very high chance that patient is also throwing pulmonary emboli as well, however unable to obtain CTA due to renal failure.  No evidence of right heart strain on echo is reassuring.  - Continue to monitor   # Acute Renal Failure  Given that patient continues to have renal improvement with fluid resuscitation, likely prerenal in the setting of acute infection.  Nephrology was consulted and does not feel there is an  aspect of nephritis.   - Monitor strict I&O on foley catheter   - Continue to trend renal function - Avoid nephrotoxic agents  - Continue IV maintenance fluids   # Right Peri-nephritic Lesion  # Urinary Retention  On admission, patient's renal ultrasound was noted to have a right perinephritic lesion.  Noncontrasted CT was obtained and showed a moderate to large lesion concerning for a perirenal hematoma most likely, with malignancy versus infection less likely.  Given mild hydronephrosis, urology was consulted does not feel invasive intervention is warranted at this time.  Patient had frank gross hematuria yesterday that has resolved today  - Urology following; appreciate their recommendations - Continue foley catheter   # Normocytic Anemia   Hemoglobin is stable despite gross hematuria yesterday that has since resolved.  Current anemia is likely due to acute illness.  - Continue to monitor daily CBC's  # Pre-diabetes - Continue monitoring CBG's while on steroids - Outpatient follow up   # Aortic Atherosclerosis CT Abdomen/pelvis showed severe aortic atherosclerosis. Patient would benefit from ASA / statin therapy.   - Will hold off on ASA / Plavix until acute illness improves  # Incidental Pulmonary Nodules CT abdomen pelvis showed 2 pulmonary nodules at the bilateral lung bases up to 20mm on the L. May be infectious in the case of COVID pneumonia, although could be neoplastic.   - Recommend outpatient follow up  Prior to Admission Living Arrangement: Home Anticipated Discharge Location: TBD Barriers to Discharge: Respiratory Failure, Renal Failure  Dr. Jose Persia Internal Medicine PGY-2  Pager: [phone] After 5pm on weekdays and 1pm on weekends: On Call pager [phone]  03/14/2020, 7:39 AM

## 2020-03-14 NOTE — Progress Notes (Signed)
Patient ID: Philip Stafford, male   DOB: 1955/08/15, 64 y.o.   MRN: [id number] [facility] Progress Note   Assessment/ Plan:   1.  Acute kidney injury: Unknown renal baseline due to lack of reliable/regular medical follow-up in the past.    Acute injury likely hemodynamically mediated in the setting of febrile illness/COVID-19; he has excellent urine output with continued improvement of renal function seen on labs and without any acute electrolyte abnormalities.  He does not have any volume overload noted to prompt adjustments to treatment at this time.  Recommend continued surveillance of renal function with labs and consequently by his primary care provider upon discharge with renal follow-up if continues to have renal dysfunction. 2.  Right perinephric mass/fluid collection: Seen yesterday by urology and findings so far favor perinephric hematoma.  Plans noted for outpatient radiology follow-up and repeat imaging to inquire into the possibility of neoplasm/RCC. 3.  COVID-19 infection/pneumonia/acute hypoxic respiratory failure: He is ongoing treatment with Decadron and oxygen supplementation.  He is out of the therapeutic window for remdesivir and renal failure at this time precludes Barcitnib.    Ongoing coverage for community-acquired pneumonia with ceftriaxone and azithromycin.  Improving markers of inflammation with downtrending CRP. 4.  Incidental pulmonary nodules: Suspect infectious etiology at this time and will need reevaluation with imaging after he recovers from acute illness possibly in 3 to 6 months.  Nephrology service will sign off at this time and remain available for questions or concerns.  Subjective:   Complains of feeling fatigued and having "trouble urinating"-indwelling Foley catheter present.   Objective:   BP (!) 149/86    Pulse 67    Temp (!) 97.2 F (36.2 C) (Axillary)    Resp (!) 22    Ht 5\' 7"  (1.702 m)    Wt 96.6 kg    SpO2 (!) 87%    BMI 33.35 kg/m    Intake/Output Summary (Last 24 hours) at 03/14/2020 1053 Last data filed at 03/14/2020 0518 Gross per 24 hour  Intake 1209.43 ml  Output 2380 ml  Net -1170.57 ml   Weight change:   Physical Exam: Gen: Appears comfortable resting in bed, on supplemental oxygen via [place]/facemask CVS: Pulse regular rhythm, normal rate, S1 and S2 normal Resp: Coarse/transmitted breath sounds bilaterally with no wheeze/rhonchi Abd: Soft, flat, mildly tender over suprapubic area Ext: No lower extremity edema  Urine bag draining clear urine-significantly improved from yesterday  Imaging: CT ABDOMEN PELVIS WO CONTRAST  Addendum Date: 03/19/2020   ADDENDUM REPORT: 02/28/2020 21:51 ADDENDUM: Other differential for right perinephric collection as it aerated in the previously performed ultrasound. Ultimately a contrast enhanced examination may be required for further evaluation when clinically feasible. Electronically Signed   By: Donavan Foil M.D.   On: 02/26/2020 21:51   Result Date: 03/11/2020 CLINICAL DATA:  Renal failure new acute renal failure EXAM: CT ABDOMEN AND PELVIS WITHOUT CONTRAST TECHNIQUE: Multidetector CT imaging of the abdomen and pelvis was performed following the standard protocol without IV contrast. COMPARISON:  Ultrasound 03/05/2020 FINDINGS: Lower chest: Lung bases demonstrate extensive ground-glass density. 12 mm left lower lobe lung nodule, series 5, image number 5. 9 mm right lower lobe lung nodule, series 5, image number 9. No pleural effusion. Normal cardiac size Hepatobiliary: No focal liver abnormality is seen. No gallstones, gallbladder wall thickening, or biliary dilatation. Pancreas: Unremarkable. No pancreatic ductal dilatation or surrounding inflammatory changes. Spleen: Normal in size without focal abnormality. Adrenals/Urinary Tract: Adrenal glands are normal. Left kidney shows  no hydronephrosis. The bladder is unremarkable. Suspected slightly dense right perinephric collection,  difficult to separate from renal parenchyma without contrast. Mild perinephric fat stranding. Suspect compression of right renal parenchyma. There also appears to be mild right hydronephrosis. Stomach/Bowel: Stomach is within normal limits. Appendix appears normal. No evidence of bowel wall thickening, distention, or inflammatory changes. Mild diverticular disease of the sigmoid colon without acute inflammatory change Vascular/Lymphatic: Advanced aortic atherosclerosis. No aneurysm. No suspicious nodes. Reproductive: Prostate is unremarkable. Other: Negative for free air or free fluid. Musculoskeletal: No acute or suspicious osseous abnormality IMPRESSION: 1. Asymmetrically enlarged right kidney, suspect moderate to large right perinephric collection. Collection appears slightly dense suggestive of perirenal hematoma though further characterisation is limited without contrast. There also appears to be mild right hydronephrosis. There is no ureteral stone. 2. Extensive ground-glass density at the bilateral lung bases, favored to represent pneumonia with other differential considerations including edema and hemorrhage. There are 2 pulmonary nodules at the bilateral lung bases measuring up to 12 mm on the left, nodules could be infectious, inflammatory, or neoplastic in etiology. 3. Sigmoid colon diverticular disease without acute inflammatory change. 4. Aortic atherosclerosis. Aortic Atherosclerosis (ICD10-I70.0). Electronically Signed: By: Donavan Foil M.D. On: 03/21/2020 19:07   US RENAL  Result Date: 03/03/2020 CLINICAL DATA:  64 year old male with acute renal insufficiency. EXAM: RENAL / URINARY TRACT ULTRASOUND COMPLETE COMPARISON:  None. FINDINGS: Right Kidney: Renal measurements: 13.3 x 6.9 x 6.7 cm = volume: 320 mL. The right kidney is enlarged and heterogeneous. There is heterogeneous and irregular peripheral tissue with increased echogenicity compared to the renal parenchyma. There is associated mass  effect and compression of the renal parenchyma. This may represent a subcapsular hematoma, an infected collection, or neoplasm. There is apparent loss of fat plane between the kidney and right lobe of the liver. An infiltrating renal mass with extension into the liver is not excluded. There is no hydronephrosis or shadowing calculus. Left Kidney: Renal measurements: 11.4 x 5.3 x 4.5 cm = volume: 143 ML. Echogenicity within normal limits. No mass or hydronephrosis visualized. Bladder: Appears normal for degree of bladder distention. Other: None. IMPRESSION: Irregular density along the periphery of the right kidney may represent a subcapsular hematoma, infected collection, or neoplasm. Ideally a contrast enhanced CT is recommended for better evaluation of the right kidney. However, given the history of acute renal insufficiency, a noncontrast CT may be performed for initial additional evaluation. Depending of the findings on the noncontrast CT, follow-up CT with IV contrast after improvement of renal function may be necessary. Electronically Signed   By: Anner Crete M.D.   On: 03/14/2020 17:56   DG CHEST PORT 1 VIEW  Result Date: 03/13/2020 CLINICAL DATA:  Decreased oxygen saturation.  COVID-19 positive EXAM: PORTABLE CHEST 1 VIEW COMPARISON:  March 12, 2020 FINDINGS: There is ill-defined airspace opacity in the left upper lobe as well as in both mid and lower lung regions. Heart is upper normal in size with pulmonary vascularity normal. No adenopathy. No bone lesions. IMPRESSION: Widespread airspace opacity bilaterally, essentially stable, likely due to multifocal atypical organism pneumonia. There may be a degree of underlying fibrosis. Stable cardiac silhouette. Electronically Signed   By: Lowella Grip III M.D.   On: 03/13/2020 12:49   DG Chest Port 1 View  Result Date: 03/15/2020 CLINICAL DATA:  Shortness of breath. EXAM: PORTABLE CHEST 1 VIEW COMPARISON:  None. FINDINGS: The heart size and  mediastinal contours are within normal limits. Diffuse interstitial thickening. More ill-defined  perihilar airspace opacity in the left upper lobe. No pleural effusion or pneumothorax. No acute osseous abnormality. IMPRESSION: 1. Left upper lobe airspace disease concerning for pneumonia. 2. Bilateral diffuse interstitial thickening may represent interstitial edema versus interstitial pneumonitis secondary to an infectious or inflammatory etiology. Electronically Signed   By: Titus Dubin M.D.   On: 03/21/2020 13:53   VAS [place] LOWER EXTREMITY VENOUS (DVT)  Result Date: 03/13/2020  Lower Venous DVT Study Indications: Elevated Ddimer.  Risk Factors: COVID 19 positive. Comparison Study: No prior studies. Performing Technologist: Oliver Hum RVT  Examination Guidelines: A complete evaluation includes B-mode imaging, spectral Doppler, color Doppler, and power Doppler as needed of all accessible portions of each vessel. Bilateral testing is considered an integral part of a complete examination. Limited examinations for reoccurring indications may be performed as noted. The reflux portion of the exam is performed with the patient in reverse Trendelenburg.  +---------+---------------+---------+-----------+----------+--------------+  RIGHT     Compressibility Phasicity Spontaneity Properties Thrombus Aging  +---------+---------------+---------+-----------+----------+--------------+  CFV       Full            Yes       Yes                                    +---------+---------------+---------+-----------+----------+--------------+  SFJ       Full                                                             +---------+---------------+---------+-----------+----------+--------------+  FV Prox   Full                                                             +---------+---------------+---------+-----------+----------+--------------+  FV Mid    Full                                                              +---------+---------------+---------+-----------+----------+--------------+  FV Distal Full                                                             +---------+---------------+---------+-----------+----------+--------------+  PFV       Full                                                             +---------+---------------+---------+-----------+----------+--------------+  POP       Full            Yes  Yes                                    +---------+---------------+---------+-----------+----------+--------------+  PTV       Full                                                             +---------+---------------+---------+-----------+----------+--------------+  PERO      Full                                                             +---------+---------------+---------+-----------+----------+--------------+   +---------+---------------+---------+-----------+----------+--------------+  LEFT      Compressibility Phasicity Spontaneity Properties Thrombus Aging  +---------+---------------+---------+-----------+----------+--------------+  CFV       Full            Yes       Yes                                    +---------+---------------+---------+-----------+----------+--------------+  SFJ       Full                                                             +---------+---------------+---------+-----------+----------+--------------+  FV Prox   Full                                                             +---------+---------------+---------+-----------+----------+--------------+  FV Mid    Full                                                             +---------+---------------+---------+-----------+----------+--------------+  FV Distal Full                                                             +---------+---------------+---------+-----------+----------+--------------+  PFV       Full                                                              +---------+---------------+---------+-----------+----------+--------------+  POP       Full            Yes       Yes                                    +---------+---------------+---------+-----------+----------+--------------+  PTV       Full                                                             +---------+---------------+---------+-----------+----------+--------------+  PERO      Full                                                             +---------+---------------+---------+-----------+----------+--------------+  Gastroc   Partial                                          Acute           +---------+---------------+---------+-----------+----------+--------------+     Summary: RIGHT: - There is no evidence of deep vein thrombosis in the lower extremity.  - No cystic structure found in the popliteal fossa.  LEFT: - Findings consistent with acute deep vein thrombosis involving the left gastrocnemius veins. - No cystic structure found in the popliteal fossa.  *See table(s) above for measurements and observations. Electronically signed by Monica Martinez MD on 03/13/2020 at 5:06:12 PM.    Final    ECHOCARDIOGRAM LIMITED  Result Date: 03/13/2020    ECHOCARDIOGRAM LIMITED REPORT   Patient Name:   Philip Stafford Date of Exam: 03/13/2020 Medical Rec #:  [id number]       Height:       67.0 in Accession #:    [id number]      Weight:       213.0 lb Date of Birth:  10-12-1955        BSA:          2.077 m Patient Age:    71 years        BP:           117/73 mmHg Patient Gender: M               HR:           63 bpm. Exam Location:  Inpatient Procedure: Limited Echo, Cardiac Doppler and Color Doppler STAT ECHO Indications:    Dyspnea R06.00  History:        Patient has no prior history of Echocardiogram examinations.                 Risk Factors:Former Smoker.  Sonographer:    Vickie Epley RDCS Referring Phys: [id number] Velna Ochs  Sonographer Comments: Covid positive. Original order was for bubble study.  Patient does not have any IV access. IMPRESSIONS  1. Left ventricular ejection fraction, by estimation, is 60 to 65%. The left ventricle has normal function. The left ventricle has no regional wall motion abnormalities.  Left ventricular diastolic parameters were normal.  2. Right ventricular systolic function is normal. The right ventricular size is normal.  3. The mitral valve is normal in structure. Trivial mitral valve regurgitation. No evidence of mitral stenosis.  4. The aortic valve is grossly normal. Aortic valve regurgitation is not visualized. No aortic stenosis is present.  5. The inferior vena cava is normal in size with greater than 50% respiratory variability, suggesting right atrial pressure of 3 mmHg. Comparison(s): No prior Echocardiogram. Conclusion(s)/Recommendation(s): Normal biventricular function without evidence of hemodynamically significant valvular heart disease. FINDINGS  Left Ventricle: Left ventricular ejection fraction, by estimation, is 60 to 65%. The left ventricle has normal function. The left ventricle has no regional wall motion abnormalities. The left ventricular internal cavity size was normal in size. There is  no left ventricular hypertrophy. Left ventricular diastolic parameters were normal. Right Ventricle: The right ventricular size is normal. Right vetricular wall thickness was not well visualized. Right ventricular systolic function is normal. Left Atrium: Left atrial size was normal in size. Right Atrium: Right atrial size was normal in size. Pericardium: There is no evidence of pericardial effusion. Mitral Valve: The mitral valve is normal in structure. Trivial mitral valve regurgitation. No evidence of mitral valve stenosis. Tricuspid Valve: The tricuspid valve is grossly normal. Tricuspid valve regurgitation is not demonstrated. No evidence of tricuspid stenosis. Aortic Valve: The aortic valve is grossly normal. Aortic valve regurgitation is not visualized. No aortic  stenosis is present. Pulmonic Valve: The pulmonic valve was not well visualized. Pulmonic valve regurgitation is not visualized. No evidence of pulmonic stenosis. Aorta: The aortic root and ascending aorta are structurally normal, with no evidence of dilitation. Venous: The inferior vena cava is normal in size with greater than 50% respiratory variability, suggesting right atrial pressure of 3 mmHg. IAS/Shunts: The atrial septum is grossly normal. LEFT VENTRICLE PLAX 2D LVIDd:         5.30 cm      Diastology LVIDs:         3.40 cm      LV e' medial:    8.70 cm/s LV PW:         0.80 cm      LV E/e' medial:  10.4 LV IVS:        0.80 cm      LV e' lateral:   10.40 cm/s LVOT diam:     2.30 cm      LV E/e' lateral: 8.7 LV SV:         105 LV SV Index:   51 LVOT Area:     4.15 cm  LV Volumes (MOD) LV vol d, MOD A2C: 161.0 ml LV vol d, MOD A4C: 142.0 ml LV vol s, MOD A2C: 62.3 ml LV vol s, MOD A4C: 51.1 ml LV SV MOD A2C:     98.7 ml LV SV MOD A4C:     142.0 ml LV SV MOD BP:      96.3 ml RIGHT VENTRICLE RV S prime:     17.00 cm/s TAPSE (M-mode): 2.5 cm LEFT ATRIUM         Index LA diam:    4.00 cm 1.93 cm/m  AORTIC VALVE LVOT Vmax:   115.00 cm/s LVOT Vmean:  72.800 cm/s LVOT VTI:    0.253 m  AORTA Ao Root diam: 3.90 cm Ao Asc diam:  3.50 cm MITRAL VALVE MV Area (PHT): 4.89 cm    SHUNTS MV Decel Time: 155 msec  Systemic VTI:  0.25 m MV E velocity: 90.40 cm/s  Systemic Diam: 2.30 cm MV A velocity: 85.70 cm/s MV E/A ratio:  1.05 Buford Dresser MD Electronically signed by Buford Dresser MD Signature Date/Time: 03/13/2020/12:28:45 PM    Final     Labs: BMET Recent Labs  Lab  1336 02/28/2020 1831 03/07/2020 2125 03/13/20 0725 03/14/20 0007  NA 133* 136 136 139 142  K 3.6 3.6 4.2 3.8 4.3  CL 95*  --  100 102 109  CO2 19*  --  22 21* 22  GLUCOSE 143*  --  139* 173* 130*  BUN 79*  --  76* 75* 68*  CREATININE 4.69*  --  3.77* 3.27* 2.28*  CALCIUM 8.3*  --  8.2* 8.4* 8.6*   CBC Recent Labs   Lab 03/23/2020 1336 03/27/2020 1831 03/13/20 0725 03/14/20 0007  WBC 7.2  --  11.1* 10.2  NEUTROABS 6.6  --  10.2* 8.7*  HGB 12.8* 11.6* 11.6* 12.1*  HCT 38.9* 34.0* 34.5* 34.8*  MCV 91.1  --  89.8 89.9  PLT 310  --  335 289   Medications:     baricitinib  4 mg Oral Daily   Chlorhexidine Gluconate Cloth  6 each Topical Daily   dexamethasone (DECADRON) injection  6 mg Intravenous Q24H   Ipratropium-Albuterol  1 puff Inhalation Q6H   sodium chloride flush  3 mL Intravenous Q12H   Elmarie Shiley, MD 03/14/2020, 10:53 AM

## 2020-03-15 DIAGNOSIS — U071 COVID-19: Secondary | ICD-10-CM | POA: Diagnosis not present

## 2020-03-15 DIAGNOSIS — J9601 Acute respiratory failure with hypoxia: Secondary | ICD-10-CM | POA: Diagnosis not present

## 2020-03-15 LAB — CBC WITH DIFFERENTIAL/PLATELET
Abs Immature Granulocytes: 0 10*3/uL (ref 0.00–0.07)
Basophils Absolute: 0 10*3/uL (ref 0.0–0.1)
Basophils Relative: 0 %
Eosinophils Absolute: 0 10*3/uL (ref 0.0–0.5)
Eosinophils Relative: 0 %
HCT: 35.8 % — ABNORMAL LOW (ref 39.0–52.0)
Hemoglobin: 11.7 g/dL — ABNORMAL LOW (ref 13.0–17.0)
Lymphocytes Relative: 5 %
Lymphs Abs: 0.4 10*3/uL — ABNORMAL LOW (ref 0.7–4.0)
MCH: 30.2 pg (ref 26.0–34.0)
MCHC: 32.7 g/dL (ref 30.0–36.0)
MCV: 92.3 fL (ref 80.0–100.0)
Monocytes Absolute: 0.4 10*3/uL (ref 0.1–1.0)
Monocytes Relative: 5 %
Neutro Abs: 6.7 10*3/uL (ref 1.7–7.7)
Neutrophils Relative %: 90 %
Platelets: 278 10*3/uL (ref 150–400)
RBC: 3.88 MIL/uL — ABNORMAL LOW (ref 4.22–5.81)
RDW: 14.8 % (ref 11.5–15.5)
WBC: 7.4 10*3/uL (ref 4.0–10.5)
nRBC: 0.5 % — ABNORMAL HIGH (ref 0.0–0.2)
nRBC: 2 /100 WBC — ABNORMAL HIGH

## 2020-03-15 LAB — COMPREHENSIVE METABOLIC PANEL
ALT: 23 U/L (ref 0–44)
AST: 39 U/L (ref 15–41)
Albumin: 2.4 g/dL — ABNORMAL LOW (ref 3.5–5.0)
Alkaline Phosphatase: 97 U/L (ref 38–126)
Anion gap: 11 (ref 5–15)
BUN: 58 mg/dL — ABNORMAL HIGH (ref 8–23)
CO2: 22 mmol/L (ref 22–32)
Calcium: 8.6 mg/dL — ABNORMAL LOW (ref 8.9–10.3)
Chloride: 110 mmol/L (ref 98–111)
Creatinine, Ser: 1.68 mg/dL — ABNORMAL HIGH (ref 0.61–1.24)
GFR, Estimated: 45 mL/min — ABNORMAL LOW (ref >60)
Glucose, Bld: 120 mg/dL — ABNORMAL HIGH (ref 70–99)
Potassium: 5 mmol/L (ref 3.5–5.1)
Sodium: 143 mmol/L (ref 135–145)
Total Bilirubin: 0.8 mg/dL (ref 0.3–1.2)
Total Protein: 5.7 g/dL — ABNORMAL LOW (ref 6.5–8.1)

## 2020-03-15 LAB — D-DIMER, QUANTITATIVE: D-Dimer, Quant: >20 ug/mL-FEU — ABNORMAL HIGH (ref 0.00–0.50)

## 2020-03-15 LAB — C-REACTIVE PROTEIN: CRP: 1.2 mg/dL — ABNORMAL HIGH (ref <1.0)

## 2020-03-15 LAB — FERRITIN: Ferritin: 837 ng/mL — ABNORMAL HIGH (ref 24–336)

## 2020-03-15 MED ORDER — LORAZEPAM 2 MG/ML IJ SOLN
0.5000 mg | Freq: Four times a day (QID) | INTRAMUSCULAR | Status: DC | PRN
  Administered 2020-03-16 (×2): 0.5 mg via INTRAVENOUS
  Filled 2020-03-15 (×2): qty 1

## 2020-03-15 MED ORDER — LABETALOL HCL 5 MG/ML IV SOLN: 5.0000 mg | INTRAVENOUS | Status: DC | PRN

## 2020-03-15 NOTE — Progress Notes (Signed)
Subjective:   Philip Stafford states he did well overnight and feels a little better. He continues to have a productive cough that does not bother him much. He denies any abdominal pain, nausea, vomiting. He is hungry and looking forward to eating.   Objective:  Vital signs in last 24 hours: Vitals:   03/14/20 2110 03/14/20 2349 03/15/20 0300 03/15/20 0442  BP:  (!) 140/105  108/87  Pulse: 61  (!) 54 67  Resp: 20 18 18  (!) 23  Temp:  98 F (36.7 C)  97.7 F (36.5 C)  TempSrc:  Axillary  Axillary  SpO2: (!) 88%  95% 91%  Weight:      Height:       Physical Exam Vitals and nursing note reviewed.  Constitutional:      Appearance: He is obese. He is ill-appearing.  Cardiovascular:     Rate and Rhythm: Normal rate and regular rhythm.     Comments: No murmurs Pulmonary:     Effort: Tachypnea and accessory muscle usage present.     Breath sounds: Rales (Throughout all lung fields) present. No rhonchi.  Abdominal:     General: Bowel sounds are normal. There is no distension.     Palpations: Abdomen is soft.     Tenderness: There is no abdominal tenderness. There is no guarding.  Neurological:     General: No focal deficit present.     Mental Status: He is alert and oriented to person, place, and time.     Comments: Encephalopathy resolved at this time.  Psychiatric:        Mood and Affect: Mood normal.        Behavior: Behavior normal.    Assessment/Plan:  Active Problems:   Pneumonia due to COVID-19 virus   Acute renal failure (HCC)   Perinephric hematoma   ARDS (adult respiratory distress syndrome) (HCC)  # COVID-19 Pneumonia  # ARDS  Oxygen requirement improving somewhat in the last 24 hours.   - Supplemental oxygen with heated high flow O2 and NRB to maintain O2 sat > 90% - Continue weaning as tolerated - Continue Decadron 6mg  (Day 4/10)  - Continue Baricitinib 2 g (Day 2/14) - Ceftriaxone 1g IV daily (Day 4/5) - Azithromycin 500mg  IV daily (Day 4/5) - Trend  inflammatory markers daily  - Continuous telemetry monitoring  - Ativan 0.25 mg q6h PRN for anxiety  # DVT  Positive for DVT in the left gastrocnemius vein.  Unfortunately patient cannot be anticoagulated at this time due to perirenal hematoma.    - Continue to monitor   # Acute Renal Failure  Creatinine continues to improve daily.   - Monitor strict I&O on foley catheter   - Continue to trend renal function - Avoid nephrotoxic agents  - Continue IV maintenance fluids   # Right Peri-nephritic Lesion  # Urinary Retention  # Hematuria  On admission, patient's renal ultrasound was noted to have a right perinephritic lesion.  Noncontrasted CT was obtained and showed a moderate to large lesion concerning for a perirenal hematoma most likely, with malignancy versus infection less likely. Hematuria resolved now.   - Urology following; appreciate their recommendations - Continue foley catheter   # Normocytic Anemia   Hemoglobin is stable despite gross hematuria yesterday that has since resolved.  Current anemia is likely due to acute illness.  - Continue to monitor daily CBC's  # Pre-diabetes - Continue monitoring CBG's while on steroids - Outpatient follow up   #  Aortic Atherosclerosis CT Abdomen/pelvis showed severe aortic atherosclerosis. Patient would benefit from ASA / statin therapy.   - Will hold off on ASA / Plavix until acute illness improves  # Incidental Pulmonary Nodules CT abdomen pelvis showed 2 pulmonary nodules at the bilateral lung bases up to 9mm on the L. May be infectious in the case of COVID pneumonia, although could be neoplastic.   - Recommend outpatient follow up  Prior to Admission Living Arrangement: Home Anticipated Discharge Location: TBD Barriers to Discharge: Respiratory Failure, Renal Failure  Dr. Jose Persia Internal Medicine PGY-2  Pager: [phone] After 5pm on weekdays and 1pm on weekends: On Call pager [phone]  03/15/2020, 7:08  AM

## 2020-03-15 NOTE — Plan of Care (Signed)
  Problem: Education: Goal: Ability to state activities that reduce stress will improve Outcome: Progressing   Problem: Coping: Goal: Ability to identify and develop effective coping behavior will improve Outcome: Progressing   Problem: Self-Concept: Goal: Ability to identify factors that promote anxiety will improve Outcome: Progressing Goal: Level of anxiety will decrease Outcome: Progressing Goal: Ability to modify response to factors that promote anxiety will improve Outcome: Progressing   Problem: Education: Goal: Knowledge of General Education information will improve Description: Including pain rating scale, medication(s)/side effects and non-pharmacologic comfort measures Outcome: Progressing   Problem: Health Behavior/Discharge Planning: Goal: Ability to manage health-related needs will improve Outcome: Progressing   Problem: Clinical Measurements: Goal: Ability to maintain clinical measurements within normal limits will improve Outcome: Progressing Goal: Will remain free from infection Outcome: Progressing Goal: Diagnostic test results will improve Outcome: Progressing Goal: Respiratory complications will improve Outcome: Progressing Goal: Cardiovascular complication will be avoided Outcome: Progressing

## 2020-03-16 ENCOUNTER — Inpatient Hospital Stay (HOSPITAL_COMMUNITY): Payer: Medicaid Other

## 2020-03-16 DIAGNOSIS — S37011A Minor contusion of right kidney, initial encounter: Secondary | ICD-10-CM

## 2020-03-16 DIAGNOSIS — J1282 Pneumonia due to coronavirus disease 2019: Secondary | ICD-10-CM | POA: Diagnosis not present

## 2020-03-16 DIAGNOSIS — N179 Acute kidney failure, unspecified: Secondary | ICD-10-CM | POA: Diagnosis not present

## 2020-03-16 DIAGNOSIS — U071 COVID-19: Secondary | ICD-10-CM | POA: Diagnosis not present

## 2020-03-16 DIAGNOSIS — J80 Acute respiratory distress syndrome: Secondary | ICD-10-CM | POA: Diagnosis not present

## 2020-03-16 DIAGNOSIS — I824Z9 Acute embolism and thrombosis of unspecified deep veins of unspecified distal lower extremity: Secondary | ICD-10-CM

## 2020-03-16 DIAGNOSIS — S37019A Minor contusion of unspecified kidney, initial encounter: Secondary | ICD-10-CM | POA: Diagnosis not present

## 2020-03-16 LAB — CBC WITH DIFFERENTIAL/PLATELET
Abs Immature Granulocytes: 0.21 10*3/uL — ABNORMAL HIGH (ref 0.00–0.07)
Basophils Absolute: 0 10*3/uL (ref 0.0–0.1)
Basophils Relative: 0 %
Eosinophils Absolute: 0 10*3/uL (ref 0.0–0.5)
Eosinophils Relative: 0 %
HCT: 41 % (ref 39.0–52.0)
Hemoglobin: 13.3 g/dL (ref 13.0–17.0)
Immature Granulocytes: 3 %
Lymphocytes Relative: 6 %
Lymphs Abs: 0.5 10*3/uL — ABNORMAL LOW (ref 0.7–4.0)
MCH: 29.7 pg (ref 26.0–34.0)
MCHC: 32.4 g/dL (ref 30.0–36.0)
MCV: 91.5 fL (ref 80.0–100.0)
Monocytes Absolute: 0.6 10*3/uL (ref 0.1–1.0)
Monocytes Relative: 8 %
Neutro Abs: 6.6 10*3/uL (ref 1.7–7.7)
Neutrophils Relative %: 83 %
Platelets: 193 10*3/uL (ref 150–400)
RBC: 4.48 MIL/uL (ref 4.22–5.81)
RDW: 14.7 % (ref 11.5–15.5)
WBC: 7.9 10*3/uL (ref 4.0–10.5)
nRBC: 0 % (ref 0.0–0.2)

## 2020-03-16 LAB — BLOOD GAS, ARTERIAL
Acid-base deficit: 4.6 mmol/L — ABNORMAL HIGH (ref 0.0–2.0)
Bicarbonate: 18.6 mmol/L — ABNORMAL LOW (ref 20.0–28.0)
Drawn by: 53309
FIO2: 100
O2 Saturation: 89.6 %
Patient temperature: 37.1
pCO2 arterial: 27.3 mmHg — ABNORMAL LOW (ref 32.0–48.0)
pH, Arterial: 7.45 (ref 7.350–7.450)
pO2, Arterial: 59.1 mmHg — ABNORMAL LOW (ref 83.0–108.0)

## 2020-03-16 LAB — GLUCOSE, CAPILLARY
Glucose-Capillary: 116 mg/dL — ABNORMAL HIGH (ref 70–99)
Glucose-Capillary: 106 mg/dL — ABNORMAL HIGH (ref 70–99)
Glucose-Capillary: 110 mg/dL — ABNORMAL HIGH (ref 70–99)
Glucose-Capillary: 115 mg/dL — ABNORMAL HIGH (ref 70–99)
Glucose-Capillary: 123 mg/dL — ABNORMAL HIGH (ref 70–99)

## 2020-03-16 LAB — COMPREHENSIVE METABOLIC PANEL
ALT: 25 U/L (ref 0–44)
AST: 37 U/L (ref 15–41)
Albumin: 2.7 g/dL — ABNORMAL LOW (ref 3.5–5.0)
Alkaline Phosphatase: 103 U/L (ref 38–126)
Anion gap: 14 (ref 5–15)
BUN: 45 mg/dL — ABNORMAL HIGH (ref 8–23)
CO2: 21 mmol/L — ABNORMAL LOW (ref 22–32)
Calcium: 8.8 mg/dL — ABNORMAL LOW (ref 8.9–10.3)
Chloride: 105 mmol/L (ref 98–111)
Creatinine, Ser: 1.47 mg/dL — ABNORMAL HIGH (ref 0.61–1.24)
GFR, Estimated: 53 mL/min — ABNORMAL LOW (ref >60)
Glucose, Bld: 130 mg/dL — ABNORMAL HIGH (ref 70–99)
Potassium: 4.7 mmol/L (ref 3.5–5.1)
Sodium: 140 mmol/L (ref 135–145)
Total Bilirubin: 1.3 mg/dL — ABNORMAL HIGH (ref 0.3–1.2)
Total Protein: 6.3 g/dL — ABNORMAL LOW (ref 6.5–8.1)

## 2020-03-16 LAB — D-DIMER, QUANTITATIVE: D-Dimer, Quant: >20 ug/mL-FEU — ABNORMAL HIGH (ref 0.00–0.50)

## 2020-03-16 LAB — FERRITIN: Ferritin: 904 ng/mL — ABNORMAL HIGH (ref 24–336)

## 2020-03-16 LAB — C-REACTIVE PROTEIN: CRP: 4.2 mg/dL — ABNORMAL HIGH (ref <1.0)

## 2020-03-16 MED ORDER — FAMOTIDINE IN NACL 20-0.9 MG/50ML-% IV SOLN
20.0000 mg | INTRAVENOUS | Status: DC
  Administered 2020-03-16 – 2020-03-17 (×2): 20 mg via INTRAVENOUS
  Filled 2020-03-16 (×3): qty 50

## 2020-03-16 MED ORDER — SODIUM CHLORIDE 0.9% FLUSH: 10.0000 mL | INTRAVENOUS | Status: DC | PRN

## 2020-03-16 MED ORDER — DEXMEDETOMIDINE HCL IN NACL 400 MCG/100ML IV SOLN
0.4000 ug/kg/h | INTRAVENOUS | Status: DC
  Administered 2020-03-16: 20:00:00 0.4 ug/kg/h via INTRAVENOUS
  Administered 2020-03-17: 18:00:00 1.2 ug/kg/h via INTRAVENOUS
  Administered 2020-03-17: 01:00:00 0.8 ug/kg/h via INTRAVENOUS
  Administered 2020-03-17 – 2020-03-18 (×10): 1.2 ug/kg/h via INTRAVENOUS
  Administered 2020-03-19: 18:00:00 1.4 ug/kg/h via INTRAVENOUS
  Administered 2020-03-19: 05:00:00 1.2 ug/kg/h via INTRAVENOUS
  Administered 2020-03-19: 16:00:00 1.3 ug/kg/h via INTRAVENOUS
  Administered 2020-03-19 (×3): 1.2 ug/kg/h via INTRAVENOUS
  Filled 2020-03-16 (×22): qty 100

## 2020-03-16 MED ORDER — SODIUM CHLORIDE 0.9% FLUSH
10.0000 mL | Freq: Two times a day (BID) | INTRAVENOUS | Status: DC
  Administered 2020-03-16 – 2020-03-21 (×9): 10 mL

## 2020-03-16 NOTE — Progress Notes (Signed)
Dr. Jeralyn Bennett called earlier today to discuss anticoagulation to treat the patient for suspected pulmonary embolus as his pulmonary status has progressively worsened.  The patient has a known right perinephric hematoma of unknown etiology with stable CBCs over the past several days.  I recommend definitive cross-sectional imaging to elucidate the underlying cause of the patient's right perinephric hematoma.  If his pulmonary status continues to decline and/or  he is diagnosed with a pulmonary embolus, okay to start anticoagulation with close monitoring of his CBCs.  If while on anticoagulation, the patient's right perinephric hematoma enlarges and/or if he has a significant drop in his hemoglobin/hematocrit, he will likely need to have an IVC filter placed.  Due to his current pulmonary status, the patient is not a surgical candidate.  Ellison Hughs, MD [facility]

## 2020-03-16 NOTE — Progress Notes (Signed)
Subjective:   Per nursing staff, Mr. Mehring has had worsening confusion and agitation over the past 24 hours, desaturating after frequently taking off his supplemental O2 and attempting to get out of bed. This morning, he was resting comfortably with [place] in place, NRB off of face, and more disoriented, believing he was at [place]. He continues to have SOB and cough but denies any pain.   Objective:  Vital signs in last 24 hours: Vitals:   03/16/20 0905 03/16/20 0955 03/16/20 1128 03/16/20 1216  BP: (!) 159/91 (!) 159/91 (!) 180/96 (!) 180/96  Pulse: 74 73 74 97  Resp: 20 (!) 24 (!) 23 (!) 32  Temp:  97.8 F (36.6 C) 97.6 F (36.4 C)   TempSrc:  Axillary Axillary   SpO2: 91% 94% 94% (!) 85%  Weight:      Height:       General: Appears uncomfortable and chronically ill, in no acute distress.  Cardiology: Rate is normal. Rhythm is regular. No lower extremity edema.  Respiratory: Patient has tachypnea with increased work of breathing and accessory muscle use on 40L heated HFNC and NRB.  Neurological: Patient is not oriented to place, repeating "trinity". Awake and alert.    Assessment/Plan:  Active Problems:   Pneumonia due to COVID-19 virus   Acute renal failure (HCC)   Perinephric hematoma   ARDS (adult respiratory distress syndrome) ([place])  # COVID-19 Pneumonia  # ARDS  Patient continues to require 40L heated HFNC / NRB to maintain oxygen sats in the high 80's-90's. Inflammatory markers have slightly increased. Repeat ABG shows improvement in PO2 to 61.6, with metabolic compensation. However, patient is becoming increasingly disoriented, likely in setting of hypoxia upon removal of O2.   - Supplemental oxygen with heated high flow O2 and NRB to maintain O2 sat > 90% - Start soft restraints  - Continue Decadron 6mg  (Day 5/10)  - Continue Baricitinib 2 g (Day 3/14) - Ceftriaxone 1g IV daily (Day 5/5) - Azithromycin 500mg  IV daily (Day 5/5) - Trend inflammatory markers daily   - Continuous telemetry monitoring  - Ativan 0.25 mg q6h PRN for anxiety  # DVT  Positive for DVT in the left gastrocnemius vein.  Unfortunately patient cannot be anticoagulated at this time due to perirenal hematoma. Believe it is less likely large pulmonary emboli are to blame for patient's worsening confusion and hypoxia, given no tachycardia and elevated blood pressures.   - Continue to monitor   # Acute Renal Failure  Creatinine continues to improve daily.   - Monitor strict I&O on foley catheter   - Continue to trend renal function - Avoid nephrotoxic agents  - Continue IV maintenance fluids   # Right Peri-Nephritic Lesion  # Urinary Retention  # Hematuria  On admission, patient's renal ultrasound was noted to have a right perinephritic lesion.  Noncontrasted CT was obtained and showed a moderate to large lesion concerning for a perirenal hematoma most likely, with malignancy versus infection less likely.   - Urology following; appreciate their recommendations - Patient would benefit from MRI vs. CT with contrast for further characterization of lesion, especially in consideration of possible anticoagulation for DVT/possible PE; however, O2 requirements will not allow for that at this time - Obtain additional imaging once oxygenation allows  - Continue foley catheter   # Normocytic Anemia, Resolved   Hemoglobin has normalized today.   - Continue to monitor daily CBC's  # Pre-diabetes CBG's remain stable.   - Outpatient follow up   #  Aortic Atherosclerosis CT Abdomen/pelvis showed severe aortic atherosclerosis. Patient would benefit from ASA / statin therapy.   - Will hold off on ASA / Plavix until acute illness improves  # Incidental Pulmonary Nodules CT abdomen pelvis showed 2 pulmonary nodules at the bilateral lung bases up to 57mm on the L. May be infectious in the case of COVID pneumonia, although could be neoplastic.   - Recommend outpatient follow up  Prior  to Admission Living Arrangement: Home Anticipated Discharge Location: TBD Barriers to Discharge: Respiratory Failure, Renal Failure  Dr. Jeralyn Bennett Internal Medicine PGY-1 Pager: [phone] After 5pm on weekdays and 1pm on weekends: On Call pager [phone]  03/16/2020, 2:26 PM

## 2020-03-16 NOTE — Progress Notes (Signed)
Per RT can not accommodate High Flow 40L of oxygen on the vent.  Cannot scan the patient at this time.  While continue to communicate with the nurse about when the patient will be off High Flow oxygen.

## 2020-03-16 NOTE — Significant Event (Signed)
Rapid Response Event Note   Reason for Call : Acute Hypoxic episode after removing oxygen (again) Initial Focused Assessment:  Nursing staff notified me of pt being confused, pulling HHFNC off, and turning upside down in bed with sats 40s-50%. Pt has had multiple episodes of AMS/confusion and pulling oxygen off. He was placed back on HHFNC and added NRB mask at 15L. Upon my arrival, Mr. Mehta was upside down in bed (head at foot of the bed), oriented to self, disoriented to place, time and situation. He is unable to make sound judgements and disrupting medical devices. BBS Coarse. Skin warm, pink and dry. Some moderate accessory muscle use. After a short period of time getting the patient repositioned and rested, sats came up to 90-91%. Nursing staff instructed to wean NRB mask as pt tolerates for sats > 85%. Pt is at high risk for emergent intubation.  Interventions:  -NRB mask at 15L and [place] at 40L/100% -Repositioned in bed    MD Notified: Per Primary Nurse Caryl Ada Call Time: 8984 Arrival Time: 2103 End Time: 0615  Madelynn Done, RN

## 2020-03-16 NOTE — Progress Notes (Addendum)
Pt found upside down in bed and minimally responsive. Pt had ripped IV out and oxygen off. RRT and RT called to bedside. Pt placed on NRB and HFNC. Yellow MEWS protocol started.   Pt responsive to treatment. Will continue to monitor.  Vital Signs        Dedra Skeens 03/16/2020,6:03 AM

## 2020-03-16 NOTE — Progress Notes (Signed)
NAME:  Philip Stafford, MRN:  [id number], DOB:  [birth year], LOS: 3 ADMISSION DATE:  03/04/2020, CONSULTATION DATE:  03/16/20 REFERRING MD:  Charleen Kirks, CHIEF COMPLAINT:  Hypoxia   Brief History:  64 year old male with history of tobacco use and minimal interaction with the healthcare system who presented to the ED with fever and shortness of breath.  Found to be Covid positive and was requiring high flow nasal cannula with concern for worsening respiratory deterioration.    PCCM consulted 12/16 ,12/17, 12/20  On 12/20 consult is in regard to worsening hypoxia, increased O2 req  and AMS   History of Present Illness:  Philip Stafford is a 64 year old male who does not follow with primary care and has a history of 70 pack years of tobacco use  (quit 11 yrs ago) who presents with fever up to 102F at home.  He had been diagnosed with Covid-19 approximately 4 days ago.  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  He has had some cough and emesis.  He was prescribed azithromycin by urgent care.  On arrival to the ED his oxygen saturations were very low, 38% though patient was awake so unlikely to be accurate.  He was placed on nonrebreather.  Work-up was significant for likely left upper lobe pneumonia and diffuse interstitial thickening with elevated inflammatory markers and a creatinine of 4.6.  Oxygen requirements increased to 40 L with PO2 of 48 on ABG, so PCCM was consulted.  On evaluation, reportedly patient's oxygen tank had run out at the time of a prior ABG.  He is currently awake and alert on 15 L nonrebreather without any respiratory distress or complaints.  Past Medical History:  Tobacco use  Significant Hospital Events:  12/16 admit to internal medicine PCCM consult 12/17 Re-consult PCCM. Found to have R renal mass, favored to be perinephric hematoma, as well as acute DVT LLE. Not anticoagulated due to hematoma  12/20 Re-consult PCCM-- worsening hypoxia and  AMS  Consults:  PCCM  Procedures:    Significant Diagnostic Tests:  TTE 12/17> LVEF 60-65% normal LV function. Normal RV size and systolic function.  LE dopplers venous - LLE DVT  12/16 CT a/p noncon> extensive GGO. 2 pulm nodules at bilateral lung bases up to 40mm. Moderate to large perinephric collection, suspected hematoma. Mild R hydronephrosis   Micro Data:  12/16 Covid-19>> positive  Antimicrobials:   azithromycin 12/16-12/20 Ceftriaxone 12/16-12/20  Interim History / Subjective:  More confused today-- reports of pt walking around room naked, taking off supplemental O2 etc  Increased O2 requirements:  HHFNC 40L and 1.0 NRB  ABG: 7.45/27.3/59.1 SpO2 94%  Reportedly desats quickly when O2 taken off or when moves.   Ddimer has been >20 since 12/19  CRP rising, is now 4.2 from 1.2 Ferritin 904  Objective   Blood pressure (!) 180/96, pulse 97, temperature 97.6 F (36.4 C), temperature source Axillary, resp. rate (!) 32, height 5\' 7"  (1.702 m), weight 96.6 kg, SpO2 (!) 85 %.    FiO2 (%):  [100 %] 100 %   Intake/Output Summary (Last 24 hours) at 03/16/2020 1612 Last data filed at 03/16/2020 1104 Gross per 24 hour  Intake 3 ml  Output 4825 ml  Net -4822 ml   Filed Weights   03/13/20 0433  Weight: 96.6 kg    General: awake restless in bed non communicative HEENT: ncat, poor dentition, sclera injected but not icteric, eomi Neuro: moves all 4 oreitned  to self but not place or other CV: rrr PULM:  Diminished bilatearlly GI: distended but non tender Extremities: no c/c/e Skin: no noticeable rashes on clothed exam.    Resolved Hospital Problem list     Assessment & Plan:    Acute hypoxemic respiratory failure 2/2 COVID-19 PNA -Has been evaluated by PCCM 12/16, 12/17 at which times did not require ICU transfer.  -Had been relayed that has high risk of decompensation giving significant history of tobacco use and unvaccinated status. -Additionally concerning  -sx began 10 days prior to presentation and symptoms have progressively worsening inpatient & at this stage could be seeing fibrotic changes (if onset of sx was onset of disease)  -If progresses to needing ventilator, mortality further worsens.   -transfer to ICU at this time for closer monitoring +/- precedex +/- NIV support. -pt sats well in low 90's when NRB in place.  Concern for possible PE with known DVT, worsening hypoxia.  -LLE DVT 12/16 -ECHO 12/17 without evidence of R heart failure. Has not gone for CTA first because of AKI, now O2 req too high -ddimer >20 on 12/19 and 12/20  -has not been on VTE ppx or systemic anticoagulation given perinephric hematoma.  P -STAT CXR 12/20  -on decadron, baricitinib -12/20 is day  day 5/5 rocephin and azithromycin   -self prone, IS, mobility as tolerated -continue supplemental O2, goal SpO2 >85 -monitor for need for ETT, though in a later stage illness course + possible PE, mechanical ventilation likely portends poor prognosis   Acute encephalopathy -suspect 2/2 hypoxia, possible delirium, CNS depressing meds -is at risk for CVA with known DVT, hypercoagulable state  P  -oxygenation goals as above -delirium precautions  -correct metabolic abnormalities as able  -close neuro monitoring   LLE DVT -has not started on systemic anticoag or on vte ppx  in setting of suspected hematoma below    Suspected perinephric hematoma  -seen by urology and nephrology -IMTS discussed concerns for PE with urology 12/20 -- continue to defer anticoag at this time   AKI, improving -trend renal indices, I.O  Inadequate PO intake -consult RDN for cortrak placement   Goals of Care:   12/17 PCCM attempted [place] conversation-- patient was reportedly distant and when asked about his feelings if condition were to worsen said he just hopes he gets better. Remains full code    Labs   Reviewed and as per EMR  CBC    Component Value Date/Time   WBC 7.9  03/16/2020 0432   RBC 4.48 03/16/2020 0432   HGB 13.3 03/16/2020 0432   HCT 41.0 03/16/2020 0432   PLT 193 03/16/2020 0432   MCV 91.5 03/16/2020 0432   MCH 29.7 03/16/2020 0432   MCHC 32.4 03/16/2020 0432   RDW 14.7 03/16/2020 0432   LYMPHSABS 0.5 (L) 03/16/2020 0432   MONOABS 0.6 03/16/2020 0432   EOSABS 0.0 03/16/2020 0432   BASOSABS 0.0 03/16/2020 0432   Iron/TIBC/Ferritin/ %Sat    Component Value Date/Time   FERRITIN 904 (H) 03/16/2020 0432     Best Practice:  Diet: NPO, sips with meds PAD: n/a VAP: n/a  DVT ppx: no scd 2/2 le dvt and no chemoprophy 2/2 bleed GI ppx: pepcid (on steroids)  Glucose monitoring: monitoring Mobility: encourage self prone  Family Update: IMTS has updated 12/20  Code Status: Full  Dispo: ICU Critical care time: The patient is critically ill with multiple organ systems failure and requires high complexity decision making for assessment and  support, frequent evaluation and titration of therapies, application of advanced monitoring technologies and extensive interpretation of multiple databases.  Critical care time 35 mins. This represents my time independent of the NPs time taking care of the pt. This is excluding procedures.    [facility] 03/16/2020, 6:56 PM

## 2020-03-16 NOTE — Progress Notes (Signed)
At 1215pm Noted patient to be very restless and anxious, getting out of bed, and wondering around the room naked, with O2 off. Patient reoriented to room and situation, placed back in bed, ativan given, made charge nurse aware that patient is pulling at all tubes and O2 sats dropping to the 60s. [place] placed on both hands. However, around 1245pm noted patient bed alarm to be going off,mittens off, patient wondering around the room again, foley still remained intact, Dr. Charleen Kirks made aware, orders for  Non-violent restraints given, waiting on soft wrist restraints at this time. Patient still pulling oxygen off. Telesitter monitor in place at this time as well.

## 2020-03-17 DIAGNOSIS — J1282 Pneumonia due to coronavirus disease 2019: Secondary | ICD-10-CM | POA: Diagnosis not present

## 2020-03-17 DIAGNOSIS — G9340 Encephalopathy, unspecified: Secondary | ICD-10-CM | POA: Diagnosis not present

## 2020-03-17 DIAGNOSIS — U071 COVID-19: Secondary | ICD-10-CM | POA: Diagnosis not present

## 2020-03-17 DIAGNOSIS — J9601 Acute respiratory failure with hypoxia: Secondary | ICD-10-CM | POA: Diagnosis not present

## 2020-03-17 LAB — MRSA PCR SCREENING: MRSA by PCR: NEGATIVE

## 2020-03-17 LAB — CBC WITH DIFFERENTIAL/PLATELET
Abs Immature Granulocytes: 0.15 10*3/uL — ABNORMAL HIGH (ref 0.00–0.07)
Basophils Absolute: 0 10*3/uL (ref 0.0–0.1)
Basophils Relative: 0 %
Eosinophils Absolute: 0 10*3/uL (ref 0.0–0.5)
Eosinophils Relative: 0 %
HCT: 40.7 % (ref 39.0–52.0)
Hemoglobin: 13.8 g/dL (ref 13.0–17.0)
Immature Granulocytes: 2 %
Lymphocytes Relative: 5 %
Lymphs Abs: 0.5 10*3/uL — ABNORMAL LOW (ref 0.7–4.0)
MCH: 31.1 pg (ref 26.0–34.0)
MCHC: 33.9 g/dL (ref 30.0–36.0)
MCV: 91.7 fL (ref 80.0–100.0)
Monocytes Absolute: 0.8 10*3/uL (ref 0.1–1.0)
Monocytes Relative: 9 %
Neutro Abs: 8 10*3/uL — ABNORMAL HIGH (ref 1.7–7.7)
Neutrophils Relative %: 84 %
Platelets: 134 10*3/uL — ABNORMAL LOW (ref 150–400)
RBC: 4.44 MIL/uL (ref 4.22–5.81)
RDW: 15.3 % (ref 11.5–15.5)
WBC: 9.4 10*3/uL (ref 4.0–10.5)
nRBC: 0.2 % (ref 0.0–0.2)

## 2020-03-17 LAB — D-DIMER, QUANTITATIVE: D-Dimer, Quant: >20 ug/mL-FEU — ABNORMAL HIGH (ref 0.00–0.50)

## 2020-03-17 LAB — CULTURE, BLOOD (ROUTINE X 2)
Culture: NO GROWTH
Culture: NO GROWTH
Special Requests: ADEQUATE
Special Requests: ADEQUATE

## 2020-03-17 LAB — COMPREHENSIVE METABOLIC PANEL
ALT: 19 U/L (ref 0–44)
AST: 25 U/L (ref 15–41)
Albumin: 2.6 g/dL — ABNORMAL LOW (ref 3.5–5.0)
Alkaline Phosphatase: 91 U/L (ref 38–126)
Anion gap: 11 (ref 5–15)
BUN: 46 mg/dL — ABNORMAL HIGH (ref 8–23)
CO2: 19 mmol/L — ABNORMAL LOW (ref 22–32)
Calcium: 8.6 mg/dL — ABNORMAL LOW (ref 8.9–10.3)
Chloride: 113 mmol/L — ABNORMAL HIGH (ref 98–111)
Creatinine, Ser: 1.49 mg/dL — ABNORMAL HIGH (ref 0.61–1.24)
GFR, Estimated: 52 mL/min — ABNORMAL LOW (ref >60)
Glucose, Bld: 121 mg/dL — ABNORMAL HIGH (ref 70–99)
Potassium: 5.1 mmol/L (ref 3.5–5.1)
Sodium: 143 mmol/L (ref 135–145)
Total Bilirubin: 1.5 mg/dL — ABNORMAL HIGH (ref 0.3–1.2)
Total Protein: 6.1 g/dL — ABNORMAL LOW (ref 6.5–8.1)

## 2020-03-17 LAB — C-REACTIVE PROTEIN: CRP: 13.5 mg/dL — ABNORMAL HIGH (ref <1.0)

## 2020-03-17 LAB — FERRITIN: Ferritin: 890 ng/mL — ABNORMAL HIGH (ref 24–336)

## 2020-03-17 MED ORDER — HALOPERIDOL LACTATE 5 MG/ML IJ SOLN
5.0000 mg | INTRAMUSCULAR | Status: AC
  Administered 2020-03-17: 10:00:00 5 mg via INTRAVENOUS
  Filled 2020-03-17: qty 1

## 2020-03-17 MED ORDER — HALOPERIDOL LACTATE 5 MG/ML IJ SOLN
5.0000 mg | Freq: Four times a day (QID) | INTRAMUSCULAR | Status: DC
  Administered 2020-03-17 – 2020-03-18 (×3): 5 mg via INTRAVENOUS
  Filled 2020-03-17 (×3): qty 1

## 2020-03-17 MED ORDER — HALOPERIDOL LACTATE 5 MG/ML IJ SOLN: 5.0000 mg | Freq: Four times a day (QID) | INTRAMUSCULAR | Status: DC

## 2020-03-17 NOTE — Progress Notes (Signed)
Initial Nutrition Assessment  DOCUMENTATION CODES:   Not applicable  INTERVENTION:   Tube Feeding via Cortrak once placed:  Vital 1.5 at 55 ml/hr Pro-Source TF 45 mL TID Provides 122 g of protein, 2100 kcals and 1003 mL of free water  Add 200 mL q 6 hours free water flush; total free water from TF and scheduled flushes is 1803 mL  Recommend scheduled bowel regimen given no BM since admission   NUTRITION DIAGNOSIS:   Inadequate oral intake related to acute illness as evidenced by NPO status.   GOAL:   Patient will meet greater than or equal to 90% of their needs   MONITOR:   Diet advancement,TF tolerance,Weight trends,Labs  REASON FOR ASSESSMENT:   Rounds    ASSESSMENT:   64 yo male admitted with acute respiratory failure with COVID-19, AKI.Also found to have renal mass (possible hematoma) and DVT.  PMH includes tobacco use but no other history on file. Not vaccinated   12/16 Admitted  Pt with ongoing confusion and agitation. Not tolerating BiPap, currently on HFNC and face mask. Pt at high risk for intubation.    NPO with plan for Cortrak today Limited documentation of po intake, 0% yesterday but no other documentation  Noted plan for MRI abdomen as able to evaluate renal mass  Unable to obtain diet and weight history from patient at this time  Labs: Creatinine 1.49, BUN 46 Meds: decadron  Diet Order:   Diet Order            Diet NPO time specified Except for: Sips with Meds  Diet effective now                 EDUCATION NEEDS:   Not appropriate for education at this time  Skin:  Skin Assessment: Reviewed RN Assessment  Last BM:  PTA  Height:   Ht Readings from Last 1 Encounters:  03/13/20 5\' 7"  (1.702 m)    Weight:   Wt Readings from Last 1 Encounters:  03/17/20 89 kg    BMI:  Body mass index is 30.73 kg/m.  Estimated Nutritional Needs:   Kcal:  2000-2200 kcals  Protein:  115-135 g  Fluid:  >/= 2 L   Kerman Passey MS, RDN,  LDN, CNSC Registered Dietitian III Clinical Nutrition RD Pager and On-Call Pager Number Located in [place]

## 2020-03-17 NOTE — Progress Notes (Signed)
[facility] Progress Note Patient Name: Philip Stafford DOB: July 16, 1955 MRN: [id number]   Date of Service  03/17/2020  HPI/Events of Note  Agitation - Request to renew bilateral soft wrist and lap belt restraint.   eICU Interventions  Will renew bilateral wrist and lap belt restraint X 11 hours.      Intervention Category Major Interventions: Delirium, psychosis, severe agitation - evaluation and management  [name] 03/17/2020, 11:24 PM

## 2020-03-17 NOTE — Progress Notes (Signed)
NAME:  Philip Stafford, MRN:  [id number], DOB:  11-19-1955, LOS: 4 ADMISSION DATE:  03/27/2020, CONSULTATION DATE:  03/17/20 REFERRING MD:  Charleen Kirks, CHIEF COMPLAINT:  Hypoxia   Brief History:   Philip Stafford is a 64 year old male who does not follow with primary care and has a history of 70 pack years of tobacco use  (quit 11 yrs ago) who presents with fever up to 102F at home.  He had been diagnosed with Covid-19 approximately 4 days ago.  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  He has had some cough and emesis.  He was prescribed azithromycin by urgent care.  On arrival to the ED his oxygen saturations were very low, 38% though patient was awake so unlikely to be accurate.  He was placed on nonrebreather.  Work-up was significant for likely left upper lobe pneumonia and diffuse interstitial thickening with elevated inflammatory markers and a creatinine of 4.6.  Oxygen requirements increased to 40 L with PO2 of 48 on ABG, so PCCM was consulted.  On evaluation, reportedly patient's oxygen tank had run out at the time of a prior ABG.  He is currently awake and alert on 15 L nonrebreather without any respiratory distress or complaints.  Past Medical History:  Tobacco use   has no past medical history on file.  has no past surgical history on file.   There is no immunization history on file for this patient.   Significant Hospital Events:  12/16 admit to internal medicine PCCM consult  12/17 Re-consult PCCM. Found to have R renal mass, favored to be perinephric hematoma, as well as acute DVT LLE. Not anticoagulated due to hematoma   12/20 Re-consult PCCM-- worsening hypoxia and AMS - More confused today-- reports of pt walking around room naked, taking off supplemental O2 etc . Increased O2 requirements:  HHFNC 40L and 1.0 NRB  ABG: 7.45/27.3/59.1 SpO2 94%  Reportedly desats quickly when O2 taken off or when moves.   Ddimer has been >20 since 12/19   CRP rising, is now 4.2 from 1.2 Ferritin 904  Consults:  PCCM  Procedures:    Significant Diagnostic Tests:  TTE 12/17> LVEF 60-65% normal LV function. Normal RV size and systolic function.  LE dopplers venous - LLE DVT  12/16 CT a/p noncon> extensive GGO. 2 pulm nodules at bilateral lung bases up to 68mm. Moderate to large perinephric collection, suspected hematoma. Mild R hydronephrosis    IMPRESSION: 1. Asymmetrically enlarged right kidney, suspect moderate to large right perinephric collection. Collection appears slightly dense suggestive of perirenal hematoma though further characterisation is limited without contrast. There also appears to be mild right hydronephrosis. There is no ureteral stone. 2. Extensive ground-glass density at the bilateral lung bases, favored to represent pneumonia with other differential considerations including edema and hemorrhage. There are 2 pulmonary nodules at the bilateral lung bases measuring up to 12 mm on the left, nodules could be infectious, inflammatory, or neoplastic in etiology. 3. Sigmoid colon diverticular disease without acute inflammatory change. 4. Aortic atherosclerosis.  Aortic Atherosclerosis (ICD10-I70.0).  Electronically Signed: By: Donavan Foil M.D.  Micro Data:  12/16 Covid-19>> positive  Antimicrobials:   azithromycin 12/16-12/20 Ceftriaxone 12/16-12/20  Interim History / Subjective:    12/21 - ongoing confusion and agitation despite precedex gtt. On face mask with HHFNC and pulse ox 86% . RT says too agitated to tolerate biPAP. Currently moaning he wants to "go home now". Think he is  in [place]  Objective   Blood pressure (!) 150/97, pulse (!) 55, temperature 97.8 F (36.6 C), temperature source Axillary, resp. rate 19, height 5\' 7"  (1.702 m), weight 89 kg, SpO2 (!) 83 %.    FiO2 (%):  [100 %] 100 %   Intake/Output Summary (Last 24 hours) at 03/17/2020 0913 Last data filed at 03/17/2020  0800 Gross per 24 hour  Intake 718.28 ml  Output 2510 ml  Net -1791.72 ml   Filed Weights   03/13/20 0433 03/17/20 0500  Weight: 96.6 kg 89 kg    General Appearance:  Looks critically ill. Not on vent. ON POSEY Head:  Normocephalic, without obvious abnormality, atraumatic Eyes:  PERRL - yes, conjunctiva/corneas - muddy     Ears:  Normal external ear canals, both ears Nose:  G tube - no Throat:  ETT TUBE - no , OG tube - no. HHNFC  +,. FAcE MasK + Neck:  Supple,  No enlargement/tenderness/nodules Lungs: Clear to auscultation bilaterally, Ventilator    Heart:  S1 and S2 normal, no murmur, CVP - no.  Pressors - no Abdomen:  Soft, no masses, no organomegaly Genitalia / Rectal:  Not done Extremities:  Extremities- intact Skin:  ntact in exposed areas . Sacral area - x Neurologic:  Sedation - none -> RASS - +1 . Moves all 4s - yes. CAM-ICU - POSITIVe . Orientation - PARTIAL   Resolved Hospital Problem list     Assessment & Plan:  COVID-19  -12/21 on barcitinb of day 4, and decadron day 6 of 10  Plan  - continue above  Hx of heavy smoking at baseline but no known resp dissues Acute hypoxemic respiratory failure 2/2 COVID-19 PNA   03/17/2020 0-not paradoxical but hypoxemic. No imminent need for intubation. But at high risk esp with encephalopathy  Plan  - HHFNC + o2  -pulse ox goal > 86% ideal  - start bipap ASAP when acute encephalopathy controlled - self prone if possible - intubate if worse   Concern for possible PE with LLE DVT -has not started on systemic anticoag or on vte ppx  in setting of suspected hematoma below  known DVT, worsening hypoxia.  -LLE DVT 12/16 -ECHO 12/17 without evidence of R heart failure. Has not gone for CTA first because of AKI,  -has not been on VTE ppx or systemic anticoagulation given perinephric hematoma.   P - monitor without anticoagulation - per urology if anticoagulation definitelyu needed - ok to sart anticoagulation but  needs IVC filter if bleeds into hematoma - CCM will not take risk giving anticoag at this point -> will order IVC filter    Suspected perinephric hematoma  -seen by urology and nephrology  Plan  -await MRI but too unstable for transfer to MRI   Acute encephalopathy -suspect 2/2 hypoxia, possible delirium, CNS depressing meds -is at risk for CVA with known DVT, hypercoagulable state   03/17/2020 - some better but still not where he needs to be tolerate bipap  P  - precedex gtt  - start haldol schedule with QTc monitoring  AKI, improving  03/17/2020 - improving  plan -trend renal indices, I.O  Inadequate PO intake -consult RDN for cortrak placement   Goals of Care:   12/17 PCCM attempted GOC conversation-- patient was reportedly distant and when asked about his feelings if condition were to worsen said he just hopes he gets better. Remains full code  12/21 Jeanene Erb daughter Philip Stafford [phone] Philip Stafford  is girl friend - per daughter - Philip Stafford should not get information because of communication barrier MD -> layperson term).     [facility]   Diet: NPO, sips with meds -> might need tF PAD: n/a VAP: n/a  DVT ppx: no scd 2/2 le dvt and no chemoprophy 2/2 bleed GI ppx: pepcid (on steroids)  Glucose monitoring: monitoring Mobility: encourage self prone  Family Update:\  Called daughter Philip Stafford Philip Stafford Philip Stafford Gorden Harms is girl friend - per daughter - Philip Stafford should not get information because of communication barrier MD -> layperson term).   Code Status: Full - Dispo: ICU under CCM (IMTS when out of ICU) service     [place]   The patient Nyzier Remmick is critically ill with multiple organ systems failure and requires high complexity decision making for assessment and support, frequent evaluation and titration of therapies, application of advanced monitoring technologies and extensive interpretation of multiple databases.    Critical Care Time devoted to patient care services described in this note is  40  Minutes. This time reflects time of care of this signee Dr Brand Males. This critical care time does not reflect procedure time, or teaching time or supervisory time of PA/NP/Med student/Med Resident etc but could involve care discussion time     Dr. Brand Males, M.D., [facility].C.P Pulmonary and Critical Care Medicine Staff Physician [facility] Pager: [phone], If no answer or between  15:00h - 7:00h: call [phone]  03/17/2020 9:17 AM     LABS    PULMONARY Recent Labs  Lab 03/20/2020 1831 03/13/20 0848 03/16/20 1250  PHART 7.464* 7.435 7.450  PCO2ART 30.9* 32.7 27.3*  PO2ART 48* 48.2* 59.1*  HCO3 22.1 21.6 18.6*  TCO2 23  --   --   O2SAT 86.0 82.0 89.6    CBC Recent Labs  Lab 03/15/20 0353 03/16/20 0432 03/17/20 0624  HGB 11.7* 13.3 13.8  HCT 35.8* 41.0 40.7  WBC 7.4 7.9 9.4  PLT 278 193 134*    COAGULATION No results for input(s): INR in the last 168 hours.  CARDIAC  No results for input(s): TROPONINI in the last 168 hours. No results for input(s): PROBNP in the last 168 hours.   CHEMISTRY Recent Labs  Lab 03/13/20 0725 03/14/20 0007 03/15/20 0353 03/16/20 0432 03/17/20 0624  NA 139 142 143 140 143  K 3.8 4.3 5.0 4.7 5.1  CL 102 109 110 105 113*  CO2 21* 22 22 21* 19*  GLUCOSE 173* 130* 120* 130* 121*  BUN 75* 68* 58* 45* 46*  CREATININE 3.27* 2.28* 1.68* 1.47* 1.49*  CALCIUM 8.4* 8.6* 8.6* 8.8* 8.6*   Estimated Creatinine Clearance: 53.3 mL/min (A) (by C-G formula based on SCr of 1.49 mg/dL (H)).   LIVER Recent Labs  Lab 03/13/20 0725 03/14/20 0007 03/15/20 0353 03/16/20 0432 03/17/20 0624  AST 36 37 39 37 25  ALT 22 22 23 25 19   ALKPHOS 85 82 97 103 91  BILITOT 0.8 0.9 0.8 1.3* 1.5*  PROT 5.8* 6.0* 5.7* 6.3* 6.1*  ALBUMIN 2.4* 2.5* 2.4* 2.7* 2.6*     INFECTIOUS Recent Labs  Lab  03/21/2020 1336 03/08/2020 1636  LATICACIDVEN 2.2* 1.7  PROCALCITON 1.50  --      ENDOCRINE CBG (last 3)  Recent Labs    03/16/20 1627 03/16/20 1951 03/16/20 2310  GLUCAP 110* 115* 123*  IMAGING x48h  - image(s) personally visualized  -   highlighted in bold DG CHEST PORT 1 VIEW  Result Date: 03/16/2020 CLINICAL DATA:  Hypoxia.  History of pneumonia due to COVID-19. EXAM: PORTABLE CHEST 1 VIEW COMPARISON:  Chest radiograph 03/14/2019 FINDINGS: Stable cardiomediastinal contours. Coarsened interstitial markings of COPD/emphysema. Diffuse interstitial opacities are noted throughout the left lung as well as the right mid and right lower lung. This is unchanged from previous exam. No pleural effusion identified. IMPRESSION: 1. No change in aeration to the lungs compared with previous exam. 2. Stable appearance of bilateral interstitial opacities compatible with COVID pneumonia. Electronically Signed   By: Kerby Moors M.D.   On: 03/16/2020 17:33

## 2020-03-18 ENCOUNTER — Inpatient Hospital Stay: Payer: Self-pay

## 2020-03-18 DIAGNOSIS — U071 COVID-19: Secondary | ICD-10-CM | POA: Diagnosis not present

## 2020-03-18 DIAGNOSIS — J80 Acute respiratory distress syndrome: Secondary | ICD-10-CM | POA: Diagnosis not present

## 2020-03-18 DIAGNOSIS — J9601 Acute respiratory failure with hypoxia: Secondary | ICD-10-CM | POA: Diagnosis not present

## 2020-03-18 LAB — COMPREHENSIVE METABOLIC PANEL
ALT: 23 U/L (ref 0–44)
AST: 26 U/L (ref 15–41)
Albumin: 2.5 g/dL — ABNORMAL LOW (ref 3.5–5.0)
Alkaline Phosphatase: 86 U/L (ref 38–126)
Anion gap: 13 (ref 5–15)
BUN: 59 mg/dL — ABNORMAL HIGH (ref 8–23)
CO2: 18 mmol/L — ABNORMAL LOW (ref 22–32)
Calcium: 9 mg/dL (ref 8.9–10.3)
Chloride: 121 mmol/L — ABNORMAL HIGH (ref 98–111)
Creatinine, Ser: 1.54 mg/dL — ABNORMAL HIGH (ref 0.61–1.24)
GFR, Estimated: 50 mL/min — ABNORMAL LOW (ref >60)
Glucose, Bld: 148 mg/dL — ABNORMAL HIGH (ref 70–99)
Potassium: 5 mmol/L (ref 3.5–5.1)
Sodium: 152 mmol/L — ABNORMAL HIGH (ref 135–145)
Total Bilirubin: 2.2 mg/dL — ABNORMAL HIGH (ref 0.3–1.2)
Total Protein: 6.6 g/dL (ref 6.5–8.1)

## 2020-03-18 LAB — CBC
HCT: 43.2 % (ref 39.0–52.0)
Hemoglobin: 14 g/dL (ref 13.0–17.0)
MCH: 30.4 pg (ref 26.0–34.0)
MCHC: 32.4 g/dL (ref 30.0–36.0)
MCV: 93.7 fL (ref 80.0–100.0)
Platelets: 166 10*3/uL (ref 150–400)
RBC: 4.61 MIL/uL (ref 4.22–5.81)
RDW: 15.6 % — ABNORMAL HIGH (ref 11.5–15.5)
WBC: 16.8 10*3/uL — ABNORMAL HIGH (ref 4.0–10.5)
nRBC: 0 % (ref 0.0–0.2)

## 2020-03-18 LAB — PHOSPHORUS: Phosphorus: 4.6 mg/dL (ref 2.5–4.6)

## 2020-03-18 LAB — GLUCOSE, CAPILLARY
Glucose-Capillary: 117 mg/dL — ABNORMAL HIGH (ref 70–99)
Glucose-Capillary: 143 mg/dL — ABNORMAL HIGH (ref 70–99)
Glucose-Capillary: 149 mg/dL — ABNORMAL HIGH (ref 70–99)
Glucose-Capillary: 149 mg/dL — ABNORMAL HIGH (ref 70–99)

## 2020-03-18 LAB — LACTIC ACID, PLASMA: Lactic Acid, Venous: 1.4 mmol/L (ref 0.5–1.9)

## 2020-03-18 LAB — MAGNESIUM: Magnesium: 3.2 mg/dL — ABNORMAL HIGH (ref 1.7–2.4)

## 2020-03-18 MED ORDER — CHLORHEXIDINE GLUCONATE 0.12 % MT SOLN
15.0000 mL | Freq: Two times a day (BID) | OROMUCOSAL | Status: DC
  Administered 2020-03-18 – 2020-03-20 (×6): 15 mL via OROMUCOSAL
  Filled 2020-03-18 (×5): qty 15

## 2020-03-18 MED ORDER — MIDAZOLAM HCL 2 MG/2ML IJ SOLN
1.0000 mg | INTRAMUSCULAR | Status: DC | PRN
  Administered 2020-03-19: 01:00:00 2 mg via INTRAVENOUS
  Administered 2020-03-19: 13:00:00 4 mg via INTRAVENOUS
  Administered 2020-03-19: 05:00:00 2 mg via INTRAVENOUS
  Filled 2020-03-18 (×3): qty 2
  Filled 2020-03-18: qty 4
  Filled 2020-03-18: qty 2

## 2020-03-18 MED ORDER — PROSOURCE TF PO LIQD
45.0000 mL | Freq: Three times a day (TID) | ORAL | Status: DC
  Administered 2020-03-18 – 2020-03-20 (×9): 45 mL
  Filled 2020-03-18 (×9): qty 45

## 2020-03-18 MED ORDER — ACETAMINOPHEN 325 MG PO TABS
650.0000 mg | ORAL_TABLET | Freq: Four times a day (QID) | ORAL | Status: DC | PRN
  Administered 2020-03-20 (×2): 650 mg
  Filled 2020-03-18 (×3): qty 2

## 2020-03-18 MED ORDER — FAMOTIDINE 20 MG PO TABS
20.0000 mg | ORAL_TABLET | Freq: Every day | ORAL | Status: DC
  Administered 2020-03-18 – 2020-03-21 (×4): 20 mg
  Filled 2020-03-18 (×4): qty 1

## 2020-03-18 MED ORDER — ORAL CARE MOUTH RINSE
15.0000 mL | Freq: Two times a day (BID) | OROMUCOSAL | Status: DC
  Administered 2020-03-18 – 2020-03-20 (×6): 15 mL via OROMUCOSAL

## 2020-03-18 MED ORDER — ACETAMINOPHEN 650 MG RE SUPP: 650.0000 mg | RECTAL | Status: DC | PRN

## 2020-03-18 MED ORDER — MIDAZOLAM HCL 2 MG/2ML IJ SOLN
INTRAMUSCULAR | Status: AC
  Administered 2020-03-18: 10:00:00 2 mg
  Filled 2020-03-18: qty 2

## 2020-03-18 MED ORDER — FREE WATER
200.0000 mL | Freq: Four times a day (QID) | Status: DC
  Administered 2020-03-18 – 2020-03-20 (×8): 200 mL

## 2020-03-18 MED ORDER — VITAL 1.5 CAL PO LIQD
1000.0000 mL | ORAL | Status: DC
  Administered 2020-03-18: 12:00:00 1000 mL
  Filled 2020-03-18 (×3): qty 1000

## 2020-03-18 MED ORDER — POLYETHYLENE GLYCOL 3350 17 G PO PACK
17.0000 g | PACK | Freq: Every day | ORAL | Status: DC | PRN
  Administered 2020-03-20: 14:00:00 17 g
  Filled 2020-03-18: qty 1

## 2020-03-18 MED ORDER — BARICITINIB 1 MG PO TABS
2.0000 mg | ORAL_TABLET | Freq: Every day | ORAL | Status: DC
  Administered 2020-03-19 – 2020-03-20 (×2): 2 mg
  Filled 2020-03-18 (×2): qty 2

## 2020-03-18 NOTE — Progress Notes (Signed)
   Request for IVC filter from CCM received 12/21  Was tentatively scheduled for same in IR  RN discussed pt status with Dr Chase Caller RN calls to inform us to HOLD for 12/21 For re evaluation with Dr Chase Caller this am  Let us know if moving ahead with IVC filter placement

## 2020-03-18 NOTE — Progress Notes (Signed)
[place] Progress Note Patient Name: Philip Stafford DOB: 1955/09/19 MRN: [id number]   Date of Service  03/18/2020  HPI/Events of Note  Renew restraints. Camera eval done.   eICU Interventions  Renewed to prevent self injury and harm.      Intervention Category Minor Interventions: Other:;Agitation / anxiety - evaluation and management  Elmer Sow 03/18/2020, 7:50 PM

## 2020-03-18 NOTE — Progress Notes (Signed)
RT attempted to place bipap on  patient with RN bedside. Patient did not tolerate well. RT placed patient back on [place] 45L/100% and NRB. Patient seems more comfortable on HHFNC/NRB.

## 2020-03-18 NOTE — Progress Notes (Signed)
Assisted tele visit to patient with daughter.  Defne Gerling Anderson, RN   

## 2020-03-18 NOTE — Progress Notes (Signed)
Nutrition Follow-up  DOCUMENTATION CODES:   Not applicable  INTERVENTION:   Initiate tube feeds via Cortrak: - Vital 1.5 @ 55 ml/hr (1320 ml/day) - ProSource TF 45 ml TID - Free water flushes of 200 ml q 6 hours  Tube feeding regimen provides 2100 kcal, 122 grams of protein, and 1003 ml of H2O.   Total free water with flushes: 1803 ml  NUTRITION DIAGNOSIS:   Inadequate oral intake related to acute illness as evidenced by NPO status.  Ongoing  GOAL:   Patient will meet greater than or equal to 90% of their needs  Met via TF  MONITOR:   Diet advancement,TF tolerance,Weight trends,Labs  REASON FOR ASSESSMENT:   Consult Enteral/tube feeding initiation and management  ASSESSMENT:   64 yo male admitted with acute respiratory failure with COVID-19, AKI.Also found to have renal mass (possible hematoma) and DVT.  PMH includes tobacco use but no other history on file. Not vaccinated  12/16 - admit 12/22 - Cortrak placed, tip gastric  Pt remains at high risk for intubation. Pt remains NPO. Per CCM note, plan is to try BiPAP this morning.  Consult received for tube feeding initiation and management. Cortrak placed this morning, tip gastric per Cortrak team.  Pt without a BM since admission. Recommend scheduled bowel regimen. Discussed with MD who would like to hold off at this time.  Medications reviewed and include: IV decadron, precedex gtt, IV pepcid  Labs reviewed: BUN 46, creatinine 1.49 CBG's: 106-123  UOP: 1775 ml x 24 hours I/O's: -7.2 L since admit  Diet Order:   Diet Order            Diet NPO time specified Except for: Sips with Meds  Diet effective now                 EDUCATION NEEDS:   Not appropriate for education at this time  Skin:  Skin Assessment: Reviewed RN Assessment  Last BM:  no documented BM  Height:   Ht Readings from Last 1 Encounters:  03/13/20 $RemoveB'5\' 7"'wGIbcnDG$  (1.702 m)    Weight:   Wt Readings from Last 1 Encounters:  03/17/20  89 kg    BMI:  Body mass index is 30.73 kg/m.  Estimated Nutritional Needs:   Kcal:  2000-2200 kcals  Protein:  115-135 grams  Fluid:  >/= 2 L    Gustavus Bryant, MS, RD, LDN Inpatient Clinical Dietitian Please see AMiON for contact information.

## 2020-03-18 NOTE — Procedures (Signed)
Cortrak  Person Inserting Tube:  Jacklynn Barnacle E, RD Tube Type:  Cortrak - 43 inches Tube Location:  Right nare Initial Placement:  Stomach Secured by: Bridle Technique Used to Measure Tube Placement:  Documented cm marking at nare/ corner of mouth Cortrak Secured At:  77 cm    Cortrak Tube Team Note:  Consult received to place a Cortrak feeding tube.   No x-ray is required. RN may begin using tube.   If the tube becomes dislodged please keep the tube and contact the Cortrak team at [email or web address] (password [email or web address]) for replacement.  If after hours and replacement cannot be delayed, place a NG tube and confirm placement with an abdominal x-ray.   Jacklynn Barnacle, MS, RD, LDN Pager number available on Amion

## 2020-03-18 NOTE — Progress Notes (Signed)
NAME:  Philip Stafford, MRN:  [id number], DOB:  09/23/1955, LOS: 5 ADMISSION DATE:  2020-03-13, CONSULTATION DATE:  03/18/20 REFERRING MD:  Philip Stafford, CHIEF COMPLAINT:  Hypoxia   Brief History:   Philip Stafford is a 64 year old male who does not follow with primary care and has a history of 70 pack years of tobacco use  (quit 11 yrs ago) who presents with fever up to 102F at home.  He had been diagnosed with Covid-19 approximately 4 days ago.  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  He has had some cough and emesis.  He was prescribed azithromycin by urgent care.  On arrival to the ED his oxygen saturations were very low, 38% though patient was awake so unlikely to be accurate.  He was placed on nonrebreather.  Work-up was significant for likely left upper lobe pneumonia and diffuse interstitial thickening with elevated inflammatory markers and a creatinine of 4.6.  Oxygen requirements increased to 40 L with PO2 of 48 on ABG, so PCCM was consulted.  On evaluation, reportedly patient's oxygen tank had run out at the time of a prior ABG.  He is currently awake and alert on 15 L nonrebreather without any respiratory distress or complaints.  Past Medical History:  Tobacco use   has no past medical history on file.  has no past surgical history on file.   There is no immunization history on file for this patient.   Significant Hospital Events:  12/16 admit to internal medicine PCCM consult  12/17 Re-consult PCCM. Found to have R renal mass, favored to be perinephric hematoma, as well as acute DVT LLE. Not anticoagulated due to hematoma   12/20 Re-consult PCCM-- worsening hypoxia and AMS - More confused today-- reports of pt walking around room naked, taking off supplemental O2 etc . Increased O2 requirements:  HHFNC 40L and 1.0 NRB  ABG: 7.45/27.3/59.1 SpO2 94%  Reportedly desats quickly when O2 taken off or when moves.   Ddimer has been >20 since 12/19   CRP rising, is now 4.2 from 1.2 Ferritin 904   12/21 - ongoing confusion and agitation despite precedex gtt. On face mask with HHFNC and pulse ox 86% . RT says too agitated to tolerate biPAP. Currently moaning he wants to "go home now". Think he is in [place]:  PCCM  Procedures:  Midline ? Date ?? PICC ordered 12/22   Significant Diagnostic Tests:  TTE 12/17> LVEF 60-65% normal LV function. Normal RV size and systolic function.  LE dopplers venous - LLE DVT  12/16 CT a/p noncon> extensive GGO. 2 pulm nodules at bilateral lung bases up to 73mm. Moderate to large perinephric collection, suspected hematoma. Mild R hydronephrosis    IMPRESSION: 1. Asymmetrically enlarged right kidney, suspect moderate to large right perinephric collection. Collection appears slightly dense suggestive of perirenal hematoma though further characterisation is limited without contrast. There also appears to be mild right hydronephrosis. There is no ureteral stone. 2. Extensive ground-glass density at the bilateral lung bases, favored to represent pneumonia with other differential considerations including edema and hemorrhage. There are 2 pulmonary nodules at the bilateral lung bases measuring up to 12 mm on the left, nodules could be infectious, inflammatory, or neoplastic in etiology. 3. Sigmoid colon diverticular disease without acute inflammatory change. 4. Aortic atherosclerosis.  Aortic Atherosclerosis (ICD10-I70.0).  Electronically Signed: By: Philip Stafford M.D.  Micro Data:  12/16 Covid-19>> positive  Antimicrobials:  azithromycin 12/16-12/20 Ceftriaxone 12/16-12/20  Interim History / Subjective:   12/22 - On HHFNC and face mask. 60KL and 100%. Still confused and agitated. Redirectable. On percedex gtt and scheeduled haldol. On Posey. QTc 509 msec. MRI abdoment and IVC filter still on hold due to encephalopathy. Midline not working. RN recommends PICC  LAb  results pending  Objective   Blood pressure (!) 153/83, pulse (!) 57, temperature 98.6 F (37 C), temperature source Axillary, resp. rate 20, height 5\' 7"  (1.702 m), weight 89 kg, SpO2 97 %.    FiO2 (%):  [100 %] 100 %   Intake/Output Summary (Last 24 hours) at 03/18/2020 0749 Last data filed at 03/18/2020 0600 Gross per 24 hour  Intake 679.8 ml  Output 1775 ml  Net -1095.2 ml   Filed Weights   03/13/20 0433 03/17/20 0500  Weight: 96.6 kg 89 kg   General Appearance:  Looks criticall ill  POSEY+ Head:  Normocephalic, without obvious abnormality, atraumatic Eyes:  PERRL - yes, conjunctiva/corneas - muddy     Ears:  Normal external ear canals, both ears Nose:  G tube - no Throat:  ETT TUBE - no , OG tube - no. HHFNC + and FACE MASK + Neck:  Supple,  No enlargement/tenderness/nodules Lungs: Clear to auscultation bilaterally, Ventilator   Synchrony - NA. RR 20s. Not paradoxical Heart:  S1 and S2 normal, no murmur, CVP - no.  Pressors - n Abdomen:  Soft, no masses, no organomegaly Genitalia / Rectal:  Not done Extremities:  Extremities- intact Skin:  ntact in exposed areas . Sacral area - not examined Neurologic:  Sedation - precedex gtt + haldol schedule -> RASS - +1 but occ +2  . Moves all 4s - yes. CAM-ICU - [phone] . Orientation - partial       Resolved Hospital Problem list     Assessment & Plan:  [id number]  -12/221 on barcitinb of day 5, and decadron day 7 of 10  Plan  - continue above  Hx of heavy smoking at baseline but no known resp dissues Acute hypoxemic respiratory failure 2/2 COVID-19 PNA   03/18/2020 - still on HHFNC and FAce mask. Meets indication for BiPAP but encephalopathy interferes though enceph some better   Plan  - HHFNC + o2  -pulse ox goal > 86% ideal  -try BiPAP 03/18/2020 AM - self prone if possible - intubate if worse   Confirmed LLE DVT with concern for possible PE with -has not started on systemic anticoag or on vte ppx  in  setting of suspected renal perinephirc hematoma below  known DVT, worsening hypoxia.  -LLE DVT 12/16 -ECHO 12/17 without evidence of R heart failure. Has not gone for CTA first because of AKI,  -has not been on VTE ppx or systemic anticoagulation given perinephric hematoma.   03/18/2020  -unable to get IVC filter due to encephalopathy  P - monitor without anticoagulation - per urology 12/20.21 note -> if anticoagulation definitelyu needed - ok to sart anticoagulation but needs IVC filter if bleeds into hematoma - CCM will not take risk giving anticoag at this point -> will order IVC filter when able    Suspected perinephric hematoma  -seen by urology and nephrology  Plan  -await MRI but too unstable for transfer to MRI   Acute encephalopathy -suspect 2/2 hypoxia, possible delirium, CNS depressing meds -is at risk for CVA with known DVT, hypercoagulable state   03/18/2020 - some better and might be where he needs to  be tolerate bipap. QTc > 567msec  P  - precedex gtt  -stop haldol - start versed prn  AKI, improving  03/18/2020 - improving 12/21  plan -trend renal indices, I.O  Inadequate PO intake -consult RDN for cortrak placement and start TF   Goals of Care:   12/17 PCCM attempted [place] [address] conversation-- patient was reportedly distant and when asked about his feelings if condition were to worsen said he just hopes he gets better. Remains full code  12/21 -  Called daughter Richmond Campbell Scales [id number] Hassan Rowan Gorden Harms is girl friend - per daughter - Hassan Rowan should not get information because of communication barrier MD -> layperson term).     BEST PRACTICE   Diet: NPO, sips with meds -> cortrak 03/18/2020 and then TF PAD: n/a VAP: n/a  DVT ppx: no scd 2/2 le dvt and no chemoprophy 2/2 bleed GI ppx: pepcid (on steroids)  Glucose monitoring: monitoring Mobility: encourage self prone  Family Update:\  Called daughter Richmond Campbell Scales [id number] Hassan Rowan Gorden Harms is girl  friend and per daughter - Hassan Rowan should not get information because of communication barrier MD -> layperson term). - updated 12/21  Code Status: Full -  Dispo: ICU under CCM (IMTS when out of ICU) service     [place]   The patient Maneesh Licata is critically ill with multiple organ systems failure and requires high complexity decision making for assessment and support, frequent evaluation and titration of therapies, application of advanced monitoring technologies and extensive interpretation of multiple databases.   Critical Care Time devoted to patient care services described in this note is  40  Minutes. This time reflects time of care of this signee Dr Brand Males. This critical care time does not reflect procedure time, or teaching time or supervisory time of PA/NP/Med student/Med Resident etc but could involve care discussion time     Dr. Brand Males, M.D., [facility]C.P Pulmonary and Critical Care Medicine Staff Physician [place] [facility] Pager: [phone], If no answer or between  15:00h - 7:00h: call [phone]  03/18/2020 9:07 AM      LABS    PULMONARY Recent Labs  Lab 03/09/2020 1831 03/13/20 0848 03/16/20 1250  PHART 7.464* 7.435 7.450  PCO2ART 30.9* 32.7 27.3*  PO2ART 48* 48.2* 59.1*  HCO3 22.1 21.6 18.6*  TCO2 23  --   --   O2SAT 86.0 82.0 89.6    CBC Recent Labs  Lab 03/15/20 0353 03/16/20 0432 03/17/20 0624  HGB 11.7* 13.3 13.8  HCT 35.8* 41.0 40.7  WBC 7.4 7.9 9.4  PLT 278 193 134*    COAGULATION No results for input(s): INR in the last 168 hours.  CARDIAC  No results for input(s): TROPONINI in the last 168 hours. No results for input(s): PROBNP in the last 168 hours.   CHEMISTRY Recent Labs  Lab 03/13/20 0725 03/14/20 0007 03/15/20 0353 03/16/20 0432 03/17/20 0624  NA 139 142 143 140 143  K 3.8 4.3 5.0 4.7 5.1  CL 102 109 110 105 113*  CO2 21* 22 22 21* 19*   GLUCOSE 173* 130* 120* 130* 121*  BUN 75* 68* 58* 45* 46*  CREATININE 3.27* 2.28* 1.68* 1.47* 1.49*  CALCIUM 8.4* 8.6* 8.6* 8.8* 8.6*   Estimated Creatinine Clearance: 53.3 mL/min (A) (by C-G formula based on SCr of 1.49 mg/dL (H)).   LIVER Recent Labs  Lab 03/13/20 0725 03/14/20 0007 03/15/20 0353 03/16/20  [id number] 03/17/20 0624  AST 36 37 39 37 25  ALT 22 22 23 25 19   ALKPHOS 85 82 97 103 91  BILITOT 0.8 0.9 0.8 1.3* 1.5*  PROT 5.8* 6.0* 5.7* 6.3* 6.1*  ALBUMIN 2.4* 2.5* 2.4* 2.7* 2.6*     INFECTIOUS Recent Labs  Lab 03/14/2020 1336 03/25/2020 1636  LATICACIDVEN 2.2* 1.7  PROCALCITON 1.50  --      ENDOCRINE CBG (last 3)  Recent Labs    03/16/20 1627 03/16/20 1951 03/16/20 2310  GLUCAP 110* 115* 123*         IMAGING x48h  - image(s) personally visualized  -   highlighted in bold DG CHEST PORT 1 VIEW  Result Date: 03/16/2020 CLINICAL DATA:  Hypoxia.  History of pneumonia due to COVID-19. EXAM: PORTABLE CHEST 1 VIEW COMPARISON:  Chest radiograph 03/14/2019 FINDINGS: Stable cardiomediastinal contours. Coarsened interstitial markings of COPD/emphysema. Diffuse interstitial opacities are noted throughout the left lung as well as the right mid and right lower lung. This is unchanged from previous exam. No pleural effusion identified. IMPRESSION: 1. No change in aeration to the lungs compared with previous exam. 2. Stable appearance of bilateral interstitial opacities compatible with COVID pneumonia. Electronically Signed   By: Kerby Moors M.D.   On: 03/16/2020 17:33

## 2020-03-19 DIAGNOSIS — J80 Acute respiratory distress syndrome: Secondary | ICD-10-CM | POA: Diagnosis not present

## 2020-03-19 DIAGNOSIS — U071 COVID-19: Secondary | ICD-10-CM | POA: Diagnosis not present

## 2020-03-19 LAB — COMPREHENSIVE METABOLIC PANEL
ALT: 76 U/L — ABNORMAL HIGH (ref 0–44)
AST: 102 U/L — ABNORMAL HIGH (ref 15–41)
Albumin: 2.3 g/dL — ABNORMAL LOW (ref 3.5–5.0)
Alkaline Phosphatase: 115 U/L (ref 38–126)
Anion gap: 10 (ref 5–15)
BUN: 69 mg/dL — ABNORMAL HIGH (ref 8–23)
CO2: 18 mmol/L — ABNORMAL LOW (ref 22–32)
Calcium: 8.9 mg/dL (ref 8.9–10.3)
Chloride: 124 mmol/L — ABNORMAL HIGH (ref 98–111)
Creatinine, Ser: 1.45 mg/dL — ABNORMAL HIGH (ref 0.61–1.24)
GFR, Estimated: 54 mL/min — ABNORMAL LOW (ref >60)
Glucose, Bld: 168 mg/dL — ABNORMAL HIGH (ref 70–99)
Potassium: 5.2 mmol/L — ABNORMAL HIGH (ref 3.5–5.1)
Sodium: 152 mmol/L — ABNORMAL HIGH (ref 135–145)
Total Bilirubin: 0.9 mg/dL (ref 0.3–1.2)
Total Protein: 6.2 g/dL — ABNORMAL LOW (ref 6.5–8.1)

## 2020-03-19 LAB — GLUCOSE, CAPILLARY
Glucose-Capillary: 173 mg/dL — ABNORMAL HIGH (ref 70–99)
Glucose-Capillary: 123 mg/dL — ABNORMAL HIGH (ref 70–99)
Glucose-Capillary: 112 mg/dL — ABNORMAL HIGH (ref 70–99)
Glucose-Capillary: 133 mg/dL — ABNORMAL HIGH (ref 70–99)
Glucose-Capillary: 133 mg/dL — ABNORMAL HIGH (ref 70–99)

## 2020-03-19 LAB — CBC
HCT: 39.5 % (ref 39.0–52.0)
Hemoglobin: 12.9 g/dL — ABNORMAL LOW (ref 13.0–17.0)
MCH: 30.4 pg (ref 26.0–34.0)
MCHC: 32.7 g/dL (ref 30.0–36.0)
MCV: 92.9 fL (ref 80.0–100.0)
Platelets: 153 10*3/uL (ref 150–400)
RBC: 4.25 MIL/uL (ref 4.22–5.81)
RDW: 15.7 % — ABNORMAL HIGH (ref 11.5–15.5)
WBC: 17.1 10*3/uL — ABNORMAL HIGH (ref 4.0–10.5)
nRBC: 0 % (ref 0.0–0.2)

## 2020-03-19 LAB — PHOSPHORUS: Phosphorus: 4.1 mg/dL (ref 2.5–4.6)

## 2020-03-19 LAB — MAGNESIUM: Magnesium: 3.4 mg/dL — ABNORMAL HIGH (ref 1.7–2.4)

## 2020-03-19 MED ORDER — LOPERAMIDE HCL 2 MG PO CAPS: 2.0000 mg | ORAL_CAPSULE | ORAL | Status: DC | PRN

## 2020-03-19 MED ORDER — THIAMINE HCL 100 MG/ML IJ SOLN: 100.0000 mg | Freq: Every day | INTRAMUSCULAR | Status: DC

## 2020-03-19 MED ORDER — FUROSEMIDE 10 MG/ML IJ SOLN
20.0000 mg | Freq: Once | INTRAMUSCULAR | Status: AC
  Administered 2020-03-19: 16:00:00 20 mg via INTRAVENOUS
  Filled 2020-03-19: qty 2

## 2020-03-19 MED ORDER — LORAZEPAM 1 MG PO TABS
1.0000 mg | ORAL_TABLET | Freq: Four times a day (QID) | ORAL | Status: DC | PRN
Start: 2020-03-19 — End: 2020-03-20
  Administered 2020-03-19 – 2020-03-20 (×3): 1 mg via ORAL
  Filled 2020-03-19 (×3): qty 1

## 2020-03-19 MED ORDER — THIAMINE HCL 100 MG/ML IJ SOLN
INTRAVENOUS | Status: DC
  Filled 2020-03-19: qty 1000

## 2020-03-19 MED ORDER — ONDANSETRON 4 MG PO TBDP: 4.0000 mg | ORAL_TABLET | Freq: Four times a day (QID) | ORAL | Status: DC | PRN

## 2020-03-19 MED ORDER — DEXMEDETOMIDINE HCL IN NACL 400 MCG/100ML IV SOLN
0.2000 ug/kg/h | INTRAVENOUS | Status: DC
  Administered 2020-03-19 – 2020-03-20 (×4): 0.7 ug/kg/h via INTRAVENOUS
  Filled 2020-03-19 (×4): qty 100

## 2020-03-19 MED ORDER — LORAZEPAM 2 MG/ML IJ SOLN
1.0000 mg | INTRAMUSCULAR | Status: DC | PRN
  Administered 2020-03-19 – 2020-03-20 (×6): 2 mg via INTRAVENOUS
  Filled 2020-03-19 (×7): qty 1

## 2020-03-19 MED ORDER — ADULT MULTIVITAMIN W/MINERALS CH
1.0000 | ORAL_TABLET | Freq: Every day | ORAL | Status: DC
  Administered 2020-03-19: 16:00:00 1 via ORAL

## 2020-03-19 MED ORDER — HYDROXYZINE HCL 25 MG PO TABS
25.0000 mg | ORAL_TABLET | Freq: Four times a day (QID) | ORAL | Status: DC | PRN
  Administered 2020-03-19 – 2020-03-20 (×4): 25 mg via ORAL
  Filled 2020-03-19 (×4): qty 1

## 2020-03-19 NOTE — Progress Notes (Signed)
NAME:  Philip Stafford, MRN:  [id number], DOB:  Dec 04, 1955, LOS: 6 ADMISSION DATE:  03/05/2020, CONSULTATION DATE:  03/19/20 REFERRING MD:  Charleen Kirks, CHIEF COMPLAINT:  Hypoxia   Brief History:   Philip Stafford is a 64 year old male who does not follow with primary care and has a history of 70 pack years of tobacco use  (quit 11 yrs ago) who presents with fever up to 102F at home.  He had been diagnosed with Covid-19 approximately 4 days ago.  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  He has had some cough and emesis.  He was prescribed azithromycin by urgent care.  On arrival to the ED his oxygen saturations were very low, 38% though patient was awake so unlikely to be accurate.  He was placed on nonrebreather.  Work-up was significant for likely left upper lobe pneumonia and diffuse interstitial thickening with elevated inflammatory markers and a creatinine of 4.6.  Oxygen requirements increased to 40 L with PO2 of 48 on ABG, so PCCM was consulted.  On evaluation, reportedly patient's oxygen tank had run out at the time of a prior ABG.  He is currently awake and alert on 15 L nonrebreather without any respiratory distress or complaints.  Past Medical History:  Tobacco use   has no past medical history on file.  has no past surgical history on file.   There is no immunization history on file for this patient.   Significant Hospital Events:  12/16 admit to internal medicine PCCM consult  12/17 Re-consult PCCM. Found to have R renal mass, favored to be perinephric hematoma, as well as acute DVT LLE. Not anticoagulated due to hematoma   12/20 Re-consult PCCM-- worsening hypoxia and AMS - More confused today-- reports of pt walking around room naked, taking off supplemental O2 etc . Increased O2 requirements:  HHFNC 40L and 1.0 NRB  ABG: 7.45/27.3/59.1 SpO2 94%  Reportedly desats quickly when O2 taken off or when moves.   Ddimer has been >20 since 12/19   CRP rising, is now 4.2 from 1.2 Ferritin 904   12/21 - ongoing confusion and agitation despite precedex gtt. On face mask with HHFNC and pulse ox 86% . RT says too agitated to tolerate biPAP. Currently moaning he wants to "go home now". Think he is in [facility]   12/22 - 12/22 - On HHFNC and face mask. 60KL and 100%. Still confused and agitated. Redirectable. On percedex gtt and scheeduled haldol. On Posey. QTc 509 msec. MRI abdoment and IVC filter still on hold due to encephalopathy. Midline not working. RN recommends PICC  Consults:  PCCM  Procedures:  Midline ? Date ?? PICC ordered 12/22  cortrak 12/22   Significant Diagnostic Tests:  TTE 12/17> LVEF 60-65% normal LV function. Normal RV size and systolic function.  LE dopplers venous - LLE DVT  12/16 CT a/p noncon> extensive GGO. 2 pulm nodules at bilateral lung bases up to 82mm. Moderate to large perinephric collection, suspected hematoma. Mild R hydronephrosis    IMPRESSION: 1. Asymmetrically enlarged right kidney, suspect moderate to large right perinephric collection. Collection appears slightly dense suggestive of perirenal hematoma though further characterisation is limited without contrast. There also appears to be mild right hydronephrosis. There is no ureteral stone. 2. Extensive ground-glass density at the bilateral lung bases, favored to represent pneumonia with other differential considerations including edema and hemorrhage. There are 2 pulmonary nodules at the bilateral lung bases measuring up to  12 mm on the left, nodules could be infectious, inflammatory, or neoplastic in etiology. 3. Sigmoid colon diverticular disease without acute inflammatory change. 4. Aortic atherosclerosis.  Aortic Atherosclerosis (ICD10-I70.0).  Electronically Signed: By: Donavan Foil M.D.  Micro Data:  12/16 Covid-19>> positive  Antimicrobials:   azithromycin 12/16-12/20 Ceftriaxone 12/16-12/20  xxx covid Rx -  decadrom 12/17 -  - barcitinib ? Start date  Interim History / Subjective:    03/19/2020 - 90% fiop2, 45L flow, Face mask +. Precedex + . Patient admitted to etoh behavior + but daughter thinks this is cofnusion. Daughter says no way patient is drinking eoth since 2009    NA 152 . Creat improving. PICC pending  - order +   Haldol n hold due to long QTc yesterday  Toleating TF +  Objective   Blood pressure (!) 123/96, pulse 65, temperature 98 F (36.7 C), temperature source Axillary, resp. rate (!) 27, height 5\' 7"  (1.702 m), weight 87.8 kg, SpO2 96 %.    FiO2 (%):  [90 %-100 %] 90 %   Intake/Output Summary (Last 24 hours) at 03/19/2020 1121 Last data filed at 03/19/2020 1610 Gross per 24 hour  Intake 1033.21 ml  Output 1355 ml  Net -321.79 ml   Filed Weights   03/13/20 0433 03/17/20 0500 03/19/20 0500  Weight: 96.6 kg 89 kg 87.8 kg     General Appearance:  Looks better Head:  Normocephalic, without obvious abnormality, atraumatic Eyes:  PERRL - yes, conjunctiva/corneas - muddy     Ears:  Normal external ear canals, both ears Nose:  G tube - panda Throat:  ETT TUBE - no , OG tube - nop. FACE MASK o2 + Neck:  Supple,  No enlargement/tenderness/nodules Lungs: Clear to auscultation bilaterally,  Heart:  S1 and S2 normal, no murmur, CVP - x.  Pressors - x Abdomen:  Soft, no masses, no organomegaly Genitalia / Rectal:  Not done Extremities:  Extremities- intact Skin:  ntact in exposed areas . Sacral area - x Neurologic:  Sedation - precedex gtt -> RASS - +1 . Moves all 4s - yes. CAM-ICU - positie but better . Orientation - not fully   Resolved Hospital Problem list     Assessment & Plan:  [id number]  -03/19/20 on barcitinb of day 5, and decadron day 7 of 10  Plan  - continue above  Hx of heavy smoking at baseline but no known resp dissues Acute hypoxemic respiratory failure 2/2 COVID-19 PNA   03/19/2020 - still on HHFNC and FAce mask and better. Encephalopathy  better.. Meets indication for BiPAPper recovery trial    Plan  - HHFNC + o2  -pulse ox goal > 86% ideal  -try BiPAP 03/19/2020 AM - self prone if possible - intubate if worse   Confirmed LLE DVT with concern for possible PE with -has not started on systemic anticoag or on vte ppx  in setting of suspected renal perinephirc hematoma below  known DVT, worsening hypoxia.  -LLE DVT 12/16 -ECHO 12/17 without evidence of R heart failure. Has not gone for CTA first because of AKI,  -has not been on VTE ppx or systemic anticoagulation given perinephric hematoma.   03/19/2020  -unable to get IVC filter due to encephalopathy  P - monitor without anticoagulation - per urology 12/20.21 note -> if anticoagulation definitelyu needed - ok to sart anticoagulation but needs IVC filter if bleeds into hematoma - CCM will not take risk giving anticoag at this point -> will order  IVC filter when able -    Suspected perinephric hematoma  -seen by urology and nephrology  Plan  -await MRI but too unstable for transfer to MRI as of 03/19/20   Acute encephalopathy -suspect 2/2 hypoxia, possible delirium, CNS depressing meds -is at risk for CVA with known DVT, hypercoagulable state   03/19/2020 - getting better.  QTc > 549msec 03/18/20 and haldol held  P  - precedex gtt  -recheck ekg 03/19/20 - start versed prn - if qtc < 532msec - can restart haldol  AKI, improving  03/19/2020 - improving 03/19/20  plan -trend renal indices, I.O  Inadequate PO intake -consult RDN for cortrak placement and start TF   Goals of Care:   12/17 PCCM attempted [place] conversation-- patient was reportedly distant and when asked about his feelings if condition were to worsen said he just hopes he gets better. Remains full code  12/21 -  Called daughter Richmond Campbell Scales [id number] Hassan Rowan Gorden Harms is girl friend - per daughter - Hassan Rowan should not get information because of communication barrier MD -> layperson term).    12/22 - updated  12/23 - updaetd  BEST PRACTICE   Diet: NPO, sips with meds -> cortrak 03/18/2020 and then TF PAD: n/a VAP: n/a  DVT ppx: no scd 2/2 le dvt and no chemoprophy 2/2 bleed GI ppx: pepcid (on steroids)  Glucose monitoring: monitoring Mobility: encourage self prone  Family Update:\  daughter Richmond Campbell Scales [id number] Hassan Rowan Gorden Harms is girl friend and per daughter - Hassan Rowan should not get information because of communication barrier MD -> layperson term).  Code Status: Full -  Dispo: ICU under CCM (IMTS when out of ICU) service     [place]   The patient Philip Stafford is critically ill with multiple organ systems failure and requires high complexity decision making for assessment and support, frequent evaluation and titration of therapies, application of advanced monitoring technologies and extensive interpretation of multiple databases.   Critical Care Time devoted to patient care services described in this note is  40  Minutes. This time reflects time of care of this signee Dr Brand Males. This critical care time does not reflect procedure time, or teaching time or supervisory time of PA/NP/Med student/Med Resident etc but could involve care discussion time     Dr. Brand Males, M.D., [facility].C.P Pulmonary and Critical Care Medicine Staff Physician [place] [facility] Pager: [phone], If no answer or between  15:00h - 7:00h: call [phone]  03/19/2020 11:21 AM      LABS    PULMONARY Recent Labs  Lab 03/05/2020 1831 03/13/20 0848 03/16/20 1250  PHART 7.464* 7.435 7.450  PCO2ART 30.9* 32.7 27.3*  PO2ART 48* 48.2* 59.1*  HCO3 22.1 21.6 18.6*  TCO2 23  --   --   O2SAT 86.0 82.0 89.6    CBC Recent Labs  Lab 03/17/20 0624 03/18/20 1226 03/19/20 0601  HGB 13.8 14.0 12.9*  HCT 40.7 43.2 39.5  WBC 9.4 16.8* 17.1*  PLT 134* 166 153    COAGULATION No results for input(s): INR  in the last 168 hours.  CARDIAC  No results for input(s): TROPONINI in the last 168 hours. No results for input(s): PROBNP in the last 168 hours.   CHEMISTRY Recent Labs  Lab 03/15/20 0353 03/16/20 0432 03/17/20 0624 03/18/20 1226 03/19/20 0601  NA 143 140 143 152* 152*  K 5.0 4.7 5.1 5.0 5.2*  CL 110 105 113* 121* 124*  CO2 22 21* 19* 18* 18*  GLUCOSE 120* 130* 121* 148* 168*  BUN 58* 45* 46* 59* 69*  CREATININE 1.68* 1.47* 1.49* 1.54* 1.45*  CALCIUM 8.6* 8.8* 8.6* 9.0 8.9  MG  --   --   --  3.2* 3.4*  PHOS  --   --   --  4.6 4.1   Estimated Creatinine Clearance: 54.5 mL/min (A) (by C-G formula based on SCr of 1.45 mg/dL (H)).   LIVER Recent Labs  Lab 03/15/20 0353 03/16/20 0432 03/17/20 0624 03/18/20 1226 03/19/20 0601  AST 39 37 25 26 102*  ALT 23 25 19 23  76*  ALKPHOS 97 103 91 86 115  BILITOT 0.8 1.3* 1.5* 2.2* 0.9  PROT 5.7* 6.3* 6.1* 6.6 6.2*  ALBUMIN 2.4* 2.7* 2.6* 2.5* 2.3*     INFECTIOUS Recent Labs  Lab 03/16/2020 1336 03/08/2020 1636 03/18/20 1226  LATICACIDVEN 2.2* 1.7 1.4  PROCALCITON 1.50  --   --      ENDOCRINE CBG (last 3)  Recent Labs    03/19/20 0343 03/19/20 0809 03/19/20 1102  GLUCAP 173* 123* 112*         IMAGING x48h  - image(s) personally visualized  -   highlighted in bold [place] EKG SITE RITE  Result Date: 03/18/2020 If Site Rite image not attached, placement could not be confirmed due to current cardiac rhythm.

## 2020-03-19 NOTE — Progress Notes (Signed)
Pt placed on NIV PC on vent per MD request (due to increased WOB)

## 2020-03-19 NOTE — Progress Notes (Signed)
Assisted tele visit to patient with daughter. ° °Alaa Mullally P, RN  °

## 2020-03-19 NOTE — Progress Notes (Signed)
Pt unable to tolerate bipap at this time. Pt taken off and placed back on HHFNC and NRB. VS WNL

## 2020-03-19 NOTE — Progress Notes (Signed)
eLink Physician-Brief Progress Note Patient Name: Philip Stafford DOB: 02/25/1956 MRN: [id number]   Date of Service  03/19/2020  HPI/Events of Note  Patient with acute respiratory failure secondary to Covid 19 pneumonia, he has a history of ETOH abuse and has agitation suspicious for ETOH withdrawal.  eICU Interventions  Will place patient on CIWA protocol.        Kerry Kass [place] Schlauch 03/19/2020, 8:52 PM

## 2020-03-20 ENCOUNTER — Inpatient Hospital Stay (HOSPITAL_COMMUNITY): Payer: Medicaid Other

## 2020-03-20 DIAGNOSIS — J9601 Acute respiratory failure with hypoxia: Secondary | ICD-10-CM | POA: Diagnosis not present

## 2020-03-20 DIAGNOSIS — J1282 Pneumonia due to coronavirus disease 2019: Secondary | ICD-10-CM | POA: Diagnosis not present

## 2020-03-20 DIAGNOSIS — J9621 Acute and chronic respiratory failure with hypoxia: Secondary | ICD-10-CM

## 2020-03-20 DIAGNOSIS — J80 Acute respiratory distress syndrome: Secondary | ICD-10-CM | POA: Diagnosis not present

## 2020-03-20 DIAGNOSIS — J9620 Acute and chronic respiratory failure, unspecified whether with hypoxia or hypercapnia: Secondary | ICD-10-CM | POA: Diagnosis not present

## 2020-03-20 DIAGNOSIS — U071 COVID-19: Secondary | ICD-10-CM | POA: Diagnosis not present

## 2020-03-20 LAB — BASIC METABOLIC PANEL
Anion gap: 7 (ref 5–15)
BUN: 96 mg/dL — ABNORMAL HIGH (ref 8–23)
CO2: 21 mmol/L — ABNORMAL LOW (ref 22–32)
Calcium: 8.6 mg/dL — ABNORMAL LOW (ref 8.9–10.3)
Chloride: 126 mmol/L — ABNORMAL HIGH (ref 98–111)
Creatinine, Ser: 2.56 mg/dL — ABNORMAL HIGH (ref 0.61–1.24)
GFR, Estimated: 27 mL/min — ABNORMAL LOW (ref >60)
Glucose, Bld: 153 mg/dL — ABNORMAL HIGH (ref 70–99)
Potassium: >7.5 mmol/L (ref 3.5–5.1)
Sodium: 154 mmol/L — ABNORMAL HIGH (ref 135–145)

## 2020-03-20 LAB — POCT I-STAT 7, (LYTES, BLD GAS, ICA,H+H)
Acid-base deficit: 7 mmol/L — ABNORMAL HIGH (ref 0.0–2.0)
Acid-base deficit: 7 mmol/L — ABNORMAL HIGH (ref 0.0–2.0)
Acid-base deficit: 6 mmol/L — ABNORMAL HIGH (ref 0.0–2.0)
Bicarbonate: 16.8 mmol/L — ABNORMAL LOW (ref 20.0–28.0)
Bicarbonate: 23.4 mmol/L (ref 20.0–28.0)
Bicarbonate: 22.2 mmol/L (ref 20.0–28.0)
Calcium, Ion: 1.31 mmol/L (ref 1.15–1.40)
Calcium, Ion: 1.3 mmol/L (ref 1.15–1.40)
Calcium, Ion: 1.28 mmol/L (ref 1.15–1.40)
HCT: 37 % — ABNORMAL LOW (ref 39.0–52.0)
HCT: 40 % (ref 39.0–52.0)
HCT: 35 % — ABNORMAL LOW (ref 39.0–52.0)
Hemoglobin: 12.6 g/dL — ABNORMAL LOW (ref 13.0–17.0)
Hemoglobin: 13.6 g/dL (ref 13.0–17.0)
Hemoglobin: 11.9 g/dL — ABNORMAL LOW (ref 13.0–17.0)
O2 Saturation: 91 %
O2 Saturation: 96 %
O2 Saturation: 99 %
Potassium: 5.7 mmol/L — ABNORMAL HIGH (ref 3.5–5.1)
Potassium: 6.4 mmol/L (ref 3.5–5.1)
Potassium: 7.2 mmol/L (ref 3.5–5.1)
Sodium: 156 mmol/L — ABNORMAL HIGH (ref 135–145)
Sodium: 156 mmol/L — ABNORMAL HIGH (ref 135–145)
Sodium: 154 mmol/L — ABNORMAL HIGH (ref 135–145)
TCO2: 18 mmol/L — ABNORMAL LOW (ref 22–32)
TCO2: 26 mmol/L (ref 22–32)
TCO2: 24 mmol/L (ref 22–32)
pCO2 arterial: 29.7 mmHg — ABNORMAL LOW (ref 32.0–48.0)
pCO2 arterial: 73.7 mmHg (ref 32.0–48.0)
pCO2 arterial: 57.9 mmHg — ABNORMAL HIGH (ref 32.0–48.0)
pH, Arterial: 7.36 (ref 7.350–7.450)
pH, Arterial: 7.11 — CL (ref 7.350–7.450)
pH, Arterial: 7.191 — CL (ref 7.350–7.450)
pO2, Arterial: 63 mmHg — ABNORMAL LOW (ref 83.0–108.0)
pO2, Arterial: 111 mmHg — ABNORMAL HIGH (ref 83.0–108.0)
pO2, Arterial: 173 mmHg — ABNORMAL HIGH (ref 83.0–108.0)

## 2020-03-20 LAB — COMPREHENSIVE METABOLIC PANEL
ALT: 311 U/L — ABNORMAL HIGH (ref 0–44)
AST: 380 U/L — ABNORMAL HIGH (ref 15–41)
Albumin: 2.3 g/dL — ABNORMAL LOW (ref 3.5–5.0)
Alkaline Phosphatase: 210 U/L — ABNORMAL HIGH (ref 38–126)
Anion gap: 12 (ref 5–15)
BUN: 79 mg/dL — ABNORMAL HIGH (ref 8–23)
CO2: 17 mmol/L — ABNORMAL LOW (ref 22–32)
Calcium: 9.2 mg/dL (ref 8.9–10.3)
Chloride: 126 mmol/L — ABNORMAL HIGH (ref 98–111)
Creatinine, Ser: 1.82 mg/dL — ABNORMAL HIGH (ref 0.61–1.24)
GFR, Estimated: 41 mL/min — ABNORMAL LOW (ref >60)
Glucose, Bld: 147 mg/dL — ABNORMAL HIGH (ref 70–99)
Potassium: 6 mmol/L — ABNORMAL HIGH (ref 3.5–5.1)
Sodium: 155 mmol/L — ABNORMAL HIGH (ref 135–145)
Total Bilirubin: 3 mg/dL — ABNORMAL HIGH (ref 0.3–1.2)
Total Protein: 6.3 g/dL — ABNORMAL LOW (ref 6.5–8.1)

## 2020-03-20 LAB — CBC
HCT: 45.7 % (ref 39.0–52.0)
HCT: 41.1 % (ref 39.0–52.0)
Hemoglobin: 14.8 g/dL (ref 13.0–17.0)
Hemoglobin: 12.5 g/dL — ABNORMAL LOW (ref 13.0–17.0)
MCH: 30.5 pg (ref 26.0–34.0)
MCH: 30.8 pg (ref 26.0–34.0)
MCHC: 32.4 g/dL (ref 30.0–36.0)
MCHC: 30.4 g/dL (ref 30.0–36.0)
MCV: 94.2 fL (ref 80.0–100.0)
MCV: 101.2 fL — ABNORMAL HIGH (ref 80.0–100.0)
Platelets: 221 10*3/uL (ref 150–400)
Platelets: 148 10*3/uL — ABNORMAL LOW (ref 150–400)
RBC: 4.85 MIL/uL (ref 4.22–5.81)
RBC: 4.06 MIL/uL — ABNORMAL LOW (ref 4.22–5.81)
RDW: 16 % — ABNORMAL HIGH (ref 11.5–15.5)
RDW: 16.5 % — ABNORMAL HIGH (ref 11.5–15.5)
WBC: 20.3 10*3/uL — ABNORMAL HIGH (ref 4.0–10.5)
WBC: 15.2 10*3/uL — ABNORMAL HIGH (ref 4.0–10.5)
nRBC: 0 % (ref 0.0–0.2)
nRBC: 0 % (ref 0.0–0.2)

## 2020-03-20 LAB — GLUCOSE, CAPILLARY
Glucose-Capillary: 132 mg/dL — ABNORMAL HIGH (ref 70–99)
Glucose-Capillary: 141 mg/dL — ABNORMAL HIGH (ref 70–99)
Glucose-Capillary: 147 mg/dL — ABNORMAL HIGH (ref 70–99)
Glucose-Capillary: 148 mg/dL — ABNORMAL HIGH (ref 70–99)
Glucose-Capillary: 152 mg/dL — ABNORMAL HIGH (ref 70–99)
Glucose-Capillary: 152 mg/dL — ABNORMAL HIGH (ref 70–99)
Glucose-Capillary: 216 mg/dL — ABNORMAL HIGH (ref 70–99)

## 2020-03-20 LAB — PHOSPHORUS: Phosphorus: 3.4 mg/dL (ref 2.5–4.6)

## 2020-03-20 LAB — LACTIC ACID, PLASMA: Lactic Acid, Venous: 1 mmol/L (ref 0.5–1.9)

## 2020-03-20 LAB — MAGNESIUM: Magnesium: 3.2 mg/dL — ABNORMAL HIGH (ref 1.7–2.4)

## 2020-03-20 MED ORDER — DEXTROSE 50 % IV SOLN
1.0000 | Freq: Once | INTRAVENOUS | Status: AC
  Administered 2020-03-20: 23:00:00 50 mL via INTRAVENOUS
  Filled 2020-03-20: qty 50

## 2020-03-20 MED ORDER — SODIUM BICARBONATE 8.4 % IV SOLN
INTRAVENOUS | Status: DC
  Filled 2020-03-20: qty 100

## 2020-03-20 MED ORDER — SODIUM CHLORIDE 0.9 % IV SOLN
1.0000 g | Freq: Once | INTRAVENOUS | Status: AC
  Administered 2020-03-20: 23:00:00 1 g via INTRAVENOUS
  Filled 2020-03-20: qty 10

## 2020-03-20 MED ORDER — INSULIN ASPART 100 UNIT/ML IV SOLN
10.0000 [IU] | Freq: Once | INTRAVENOUS | Status: AC
  Administered 2020-03-20: 23:00:00 10 [IU] via INTRAVENOUS

## 2020-03-20 MED ORDER — FENTANYL 2500MCG IN NS 250ML (10MCG/ML) PREMIX INFUSION
50.0000 ug/h | INTRAVENOUS | Status: DC
  Administered 2020-03-20: 17:00:00 50 ug/h via INTRAVENOUS
  Administered 2020-03-21 – 2020-03-22 (×3): 200 ug/h via INTRAVENOUS
  Filled 2020-03-20 (×4): qty 250

## 2020-03-20 MED ORDER — SODIUM BICARBONATE 8.4 % IV SOLN
50.0000 meq | Freq: Once | INTRAVENOUS | Status: AC
  Administered 2020-03-20: 23:00:00 50 meq via INTRAVENOUS
  Filled 2020-03-20: qty 50

## 2020-03-20 MED ORDER — FREE WATER
200.0000 mL | Status: DC
  Administered 2020-03-20 – 2020-03-21 (×6): 200 mL

## 2020-03-20 MED ORDER — DOCUSATE SODIUM 50 MG/5ML PO LIQD
100.0000 mg | Freq: Two times a day (BID) | ORAL | Status: DC
  Administered 2020-03-20: 21:00:00 100 mg
  Filled 2020-03-20: qty 10

## 2020-03-20 MED ORDER — FENTANYL CITRATE (PF) 100 MCG/2ML IJ SOLN
INTRAMUSCULAR | Status: AC
  Filled 2020-03-20: qty 2

## 2020-03-20 MED ORDER — PROPOFOL 1000 MG/100ML IV EMUL
0.0000 ug/kg/min | INTRAVENOUS | Status: DC
  Administered 2020-03-20: 17:00:00 10 ug/kg/min via INTRAVENOUS
  Administered 2020-03-21: 02:00:00 15 ug/kg/min via INTRAVENOUS
  Filled 2020-03-20 (×2): qty 100

## 2020-03-20 MED ORDER — FENTANYL CITRATE (PF) 100 MCG/2ML IJ SOLN: 50.0000 ug | Freq: Once | INTRAMUSCULAR | Status: AC

## 2020-03-20 MED ORDER — SODIUM BICARBONATE 8.4 % IV SOLN
INTRAVENOUS | Status: DC
  Filled 2020-03-20 (×4): qty 850

## 2020-03-20 MED ORDER — MIDAZOLAM HCL 2 MG/2ML IJ SOLN
INTRAMUSCULAR | Status: AC
  Administered 2020-03-20: 17:00:00 2 mg
  Filled 2020-03-20: qty 4

## 2020-03-20 MED ORDER — FENTANYL CITRATE (PF) 100 MCG/2ML IJ SOLN
50.0000 ug | Freq: Once | INTRAMUSCULAR | Status: AC
  Administered 2020-03-20: 17:00:00 50 ug via INTRAVENOUS

## 2020-03-20 MED ORDER — HALOPERIDOL LACTATE 5 MG/ML IJ SOLN
5.0000 mg | Freq: Four times a day (QID) | INTRAMUSCULAR | Status: DC
  Administered 2020-03-20 (×2): 5 mg via INTRAVENOUS
  Filled 2020-03-20 (×2): qty 1

## 2020-03-20 MED ORDER — ROCURONIUM BROMIDE 10 MG/ML (PF) SYRINGE
PREFILLED_SYRINGE | INTRAVENOUS | Status: AC
  Administered 2020-03-20: 17:00:00 80 mg
  Filled 2020-03-20: qty 10

## 2020-03-20 MED ORDER — LACTATED RINGERS IV BOLUS
1000.0000 mL | Freq: Once | INTRAVENOUS | Status: AC
  Administered 2020-03-20: 20:00:00 1000 mL via INTRAVENOUS

## 2020-03-20 MED ORDER — DEXMEDETOMIDINE HCL IN NACL 400 MCG/100ML IV SOLN
0.4000 ug/kg/h | INTRAVENOUS | Status: DC
  Administered 2020-03-20 – 2020-03-21 (×2): 0.7 ug/kg/h via INTRAVENOUS
  Administered 2020-03-21 – 2020-03-22 (×4): 0.8 ug/kg/h via INTRAVENOUS
  Filled 2020-03-20 (×2): qty 100
  Filled 2020-03-20: qty 200
  Filled 2020-03-20 (×5): qty 100

## 2020-03-20 MED ORDER — ETOMIDATE 2 MG/ML IV SOLN
20.0000 mg | Freq: Once | INTRAVENOUS | Status: AC
  Administered 2020-03-20: 17:00:00 20 mg via INTRAVENOUS

## 2020-03-20 MED ORDER — FENTANYL BOLUS VIA INFUSION
50.0000 ug | INTRAVENOUS | Status: DC | PRN
  Administered 2020-03-21 – 2020-03-22 (×2): 50 ug via INTRAVENOUS
  Filled 2020-03-20: qty 50

## 2020-03-20 MED ORDER — ROCURONIUM BROMIDE 50 MG/5ML IV SOLN
80.0000 mg | Freq: Once | INTRAVENOUS | Status: AC
  Filled 2020-03-20: qty 8

## 2020-03-20 MED ORDER — MIDAZOLAM HCL 2 MG/2ML IJ SOLN: 2.0000 mg | Freq: Once | INTRAMUSCULAR | Status: AC

## 2020-03-20 MED ORDER — ETOMIDATE 2 MG/ML IV SOLN
INTRAVENOUS | Status: AC
  Filled 2020-03-20: qty 20

## 2020-03-20 MED ORDER — MIDAZOLAM HCL (PF) 5 MG/ML IJ SOLN: 1.0000 mg | INTRAMUSCULAR | Status: DC | PRN

## 2020-03-20 MED ORDER — POLYETHYLENE GLYCOL 3350 17 G PO PACK: 17.0000 g | PACK | Freq: Every day | ORAL | Status: DC

## 2020-03-20 NOTE — Progress Notes (Signed)
   RT gave abg result   Recent Labs  Lab 03/16/20 1250 03/20/20 1428 03/20/20 1816  PHART 7.450 7.360 7.110*  PCO2ART 27.3* 29.7* 73.7*  PO2ART 59.1* 63* 111*  HCO3 18.6* 16.8* 23.4  TCO2  --  18* 26  O2SAT 89.6 91.0 96.0   Plan   Increae RR on vent from 28 0> 32 -> rechedk abg start bic gtt Consider nimbex and/or prone based on followup ABG  15 min additional ccm time   SIGNATURE    Dr. Brand Males, M.D., F.C.C.P,  Pulmonary and Critical Care Medicine Staff Physician, [place] Director - Interstitial Lung Disease  Program  Pulmonary [place] [facility] [place], [place], [address]  Pager: [phone], If no answer  OR between  19:00-7:00h: page [phone]  [phone] Telephone (clinical office): [phone] Telephone (research): [phone]  6:37 PM 03/20/2020

## 2020-03-20 NOTE — Progress Notes (Signed)
NAME:  Philip Stafford, MRN:  [id number], DOB:  1955-06-23, LOS: 7 ADMISSION DATE:  03/27/2020, CONSULTATION DATE:  03/20/20 REFERRING MD:  Charleen Kirks, CHIEF COMPLAINT:  Hypoxia   Brief History:   Philip Stafford is a 64 year old male who does not follow with primary care and has a history of 70 pack years of tobacco use  (quit 11 yrs ago) who presents with fever up to 102F at home.  He had been diagnosed with Covid-19 approximately 4 days ago.  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  He has had some cough and emesis.  He was prescribed azithromycin by urgent care.  On arrival to the ED his oxygen saturations were very low, 38% though patient was awake so unlikely to be accurate.  He was placed on nonrebreather.  Work-up was significant for likely left upper lobe pneumonia and diffuse interstitial thickening with elevated inflammatory markers and a creatinine of 4.6.  Oxygen requirements increased to 40 L with PO2 of 48 on ABG, so PCCM was consulted.  On evaluation, reportedly patient's oxygen tank had run out at the time of a prior ABG.  He is currently awake and alert on 15 L nonrebreather without any respiratory distress or complaints.  Past Medical History:  Tobacco use   has no past medical history on file.  has no past surgical history on file.   There is no immunization history on file for this patient.   Significant Hospital Events:  12/16 admit to internal medicine PCCM consult  12/17 Re-consult PCCM. Found to have R renal mass, favored to be perinephric hematoma, as well as acute DVT LLE. Not anticoagulated due to hematoma   12/20 Re-consult PCCM-- worsening hypoxia and AMS - More confused today-- reports of pt walking around room naked, taking off supplemental O2 etc . Increased O2 requirements:  HHFNC 40L and 1.0 NRB  ABG: 7.45/27.3/59.1 SpO2 94%  Reportedly desats quickly when O2 taken off or when moves.   Ddimer has been >20 since 12/19   CRP rising, is now 4.2 from 1.2 Ferritin 904   12/21 - ongoing confusion and agitation despite precedex gtt. On face mask with HHFNC and pulse ox 86% . RT says too agitated to tolerate biPAP. Currently moaning he wants to "go home now". Think he is in [facility]   12/22 - 12/22 - On HHFNC and face mask. 60KL and 100%. Still confused and agitated. Redirectable. On percedex gtt and scheeduled haldol. On Posey. QTc 509 msec. MRI abdoment and IVC filter still on hold due to encephalopathy. Midline not working. RN recommends PICC  12/23 0- 90% fiop2, 45L flow, Face mask +. Precedex + . Patient admitted to etoh behavior + but daughter thinks this is cofnusion. Daughter says no way patient is drinking eoth since 2009.  NA 152 . Creat improving. PICC pending  - order +  . Haldol n hold due to long QTc yesterday, Toleating TF +  Consults:  PCCM  Procedures:  Midline ? Date ?? PICC ordered 12/22  cortrak 12/22   Significant Diagnostic Tests:  TTE 12/17> LVEF 60-65% normal LV function. Normal RV size and systolic function.  LE dopplers venous - LLE DVT  12/16 CT a/p noncon> extensive GGO. 2 pulm nodules at bilateral lung bases up to 61mm. Moderate to large perinephric collection, suspected hematoma. Mild R hydronephrosis    IMPRESSION: 1. Asymmetrically enlarged right kidney, suspect moderate to large right perinephric collection. Collection appears  slightly dense suggestive of perirenal hematoma though further characterisation is limited without contrast. There also appears to be mild right hydronephrosis. There is no ureteral stone. 2. Extensive ground-glass density at the bilateral lung bases, favored to represent pneumonia with other differential considerations including edema and hemorrhage. There are 2 pulmonary nodules at the bilateral lung bases measuring up to 12 mm on the left, nodules could be infectious, inflammatory, or neoplastic in etiology. 3. Sigmoid colon diverticular  disease without acute inflammatory change. 4. Aortic atherosclerosis.  Aortic Atherosclerosis (ICD10-I70.0).  Electronically Signed: By: Donavan Foil M.D.  Micro Data:  12/16 Covid-19>> positive  Antimicrobials:   azithromycin 12/16-12/20 Ceftriaxone 12/16-12/20  xxx covid Rx - decadrom 12/17 -  - barcitinib ? Start date  Interim History / Subjective:    03/20/2020 - briefly used bipap yesterda morning. On precedex gtt. Fio2 100% on 55L HHFNC. -> overnight -> t his am reduced to 80% and 45L HHFNC CXR today same v better. Haldol on hold->  QTc improved to 446 msec  yesterday on ekg . On CIWA protocol since last night.   Objective   Blood pressure (!) 122/103, pulse 96, temperature 99.8 F (37.7 C), temperature source Axillary, resp. rate (!) 29, height 5\' 7"  (1.702 m), weight 84.5 kg, SpO2 92 %.    FiO2 (%):  [80 %-100 %] 100 %   Intake/Output Summary (Last 24 hours) at 03/20/2020 [id number] Last data filed at 03/20/2020 0700 Gross per 24 hour  Intake 1203.22 ml  Output 1680 ml  Net -476.78 ml   Filed Weights   03/17/20 0500 03/19/20 0500 03/20/20 0500  Weight: 89 kg 87.8 kg 84.5 kg    General Appearance:  Looks criticall ill  Head:  Normocephalic, without obvious abnormality, atraumatic Eyes:  PERRL - yes, conjunctiva/corneas - muddy     Ears:  Normal external ear canals, both ears Nose:  G tube - yes Throat:  ETT TUBE - no , OG tube - no. POOR DENTITION + Neck:  Supple,  No enlargement/tenderness/nodules Lungs: Clear to auscultation bilaterally, PARADOXICAL MILD ,. RR 28, Pulse ox 98% Heart:  S1 and S2 normal, no murmur, CVP - x.  Pressors - no Abdomen:  Soft, no masses, no organomegaly Genitalia / Rectal:  Not done Extremities:  Extremities- intact Skin:  ntact in exposed areas . Sacral area - not examned Neurologic:  Sedation - precedex gtt and ativan prn -> RASS - -3 . Moves all 4s - yes. CAM-ICU - positive for delirium . Orientation - not        Resolved Hospital Problem list     Assessment & Plan:  [id number]  -03/19/20 on barcitinb of day 6, and decadron day 8 of 10  Plan  - continue above  Hx of heavy smoking at baseline but no known resp dissues Acute hypoxemic respiratory failure 2/2 COVID-19 PNA   03/20/2020 - still on HHFNC and FAce mask. CXR unchange. Hypoxemia not with sginfnicant improvement. ? Tiring out esp now with ativan onboard. Unable to do biPAP due to encephalopathy. Showing evidence of resp muscle fatigue  Plan  - HHFNC + o2 - RT will try to sit patient but if unable to and if condition persists -> intubate .12/24 or 12/25  -pulse ox goal > 86% ideal - intubate if worse   Confirmed LLE DVT with concern for possible PE with -has not started on systemic anticoag or on vte ppx  in setting of suspected renal perinephirc hematoma below  known DVT,  worsening hypoxia.  -LLE DVT 12/16 -ECHO 12/17 without evidence of R heart failure. Has not gone for CTA first because of AKI,  -has not been on VTE ppx or systemic anticoagulation given perinephric hematoma.   03/20/2020  -still unable to get IVC filter due to encephalopathy  P - monitor without anticoagulation (per urology 12/20.21 note -> if anticoagulation definitelyu needed - ok to sart anticoagulation but needs IVC filter if bleeds into hematoma. CCM will not take risk giving anticoag at this point -> will order IVC filter when able )    Suspected perinephric hematoma  -seen by urology and nephrology  Plan  -await MRI but too unstable for transfer to MRI as of 03/19/20 and 03/20/20   Acute encephalopathy -suspect 2/2 hypoxia, possible delirium, CNS depressing meds -is at risk for CVA with known DVT, hypercoagulable state   03/20/2020 - seemed to be getting better 12/23 and then haldol held 12.23 due to ling Qtc. Now worse and needing ativan prn. QTc now normal 12/24  P  - precedex gtt - restar haldol scheduled - restart versed  prn -dc ativen - if qtc < 526msec - can restart haldol  AKI, improving  03/20/2020 - improving 12/23/21but worse 12/24 and suggests pre-renal with Na 155 and BUN 79  plan -trend renal indices, I.O - increase free water  Inadequate PO intake  TF   Goals of Care:   12/17 PCCM attempted [facility] conversation-- patient was reportedly distant and when asked about his feelings if condition were to worsen said he just hopes he gets better. Remains full code  12/21 -  Called daughter Richmond Campbell Scales [phone] Hassan Rowan Gorden Harms is girl friend - per daughter - Hassan Rowan should not get information because of communication barrier MD -> layperson term).   12/22 - updated  12/23 - updated  12/24 - updated daughter indicated heading towards intubation . She indicated full code  BEST PRACTICE   Diet: NPO, sips with meds -> cortrak 03/18/2020 and then TF PAD: n/a VAP: n/a  DVT ppx: no scd 2/2 le dvt and no chemoprophy 2/2 bleed GI ppx: pepcid (on steroids)  Glucose monitoring: monitoring Mobility: encourage self prone  Family Update:\  daughter Richmond Campbell Scales [id number] Hassan Rowan Gorden Harms is girl friend and per daughter - Hassan Rowan should not get information because of communication barrier MD -> layperson term).  Code Status: Full -  Dispo: ICU under CCM (IMTS when out of ICU) service     [place]   The patient Nace Eppinger is critically ill with multiple organ systems failure and requires high complexity decision making for assessment and support, frequent evaluation and titration of therapies, application of advanced monitoring technologies and extensive interpretation of multiple databases.   Critical Care Time devoted to patient care services described in this note is  45  Minutes. This time reflects time of care of this signee Dr Brand Males. This critical care time does not reflect procedure time, or teaching time or supervisory time of PA/NP/Med student/Med Resident  etc but could involve care discussion time     Dr. Brand Males, M.D., [facility]C.P Pulmonary and Critical Care Medicine Staff Physician [place] [facility] Pager: [phone], If no answer or between  15:00h - 7:00h: call [phone]  03/20/2020 8:39 AM       LABS    PULMONARY Recent Labs  Lab 03/13/20 0848 03/16/20 1250  PHART 7.435 7.450  PCO2ART 32.7 27.3*  PO2ART 48.2* 59.1*  HCO3 21.6 18.6*  O2SAT 82.0 89.6    CBC Recent Labs  Lab 03/18/20 1226 03/19/20 0601 03/20/20 0500  HGB 14.0 12.9* 14.8  HCT 43.2 39.5 45.7  WBC 16.8* 17.1* 20.3*  PLT 166 153 221    COAGULATION No results for input(s): INR in the last 168 hours.  CARDIAC  No results for input(s): TROPONINI in the last 168 hours. No results for input(s): PROBNP in the last 168 hours.   CHEMISTRY Recent Labs  Lab 03/16/20 0432 03/17/20 0624 03/18/20 1226 03/19/20 0601 03/20/20 0500  NA 140 143 152* 152* 155*  K 4.7 5.1 5.0 5.2* 6.0*  CL 105 113* 121* 124* 126*  CO2 21* 19* 18* 18* 17*  GLUCOSE 130* 121* 148* 168* 147*  BUN 45* 46* 59* 69* 79*  CREATININE 1.47* 1.49* 1.54* 1.45* 1.82*  CALCIUM 8.8* 8.6* 9.0 8.9 9.2  MG  --   --  3.2* 3.4* 3.2*  PHOS  --   --  4.6 4.1 3.4   Estimated Creatinine Clearance: 42.6 mL/min (A) (by C-G formula based on SCr of 1.82 mg/dL (H)).   LIVER Recent Labs  Lab 03/16/20 0432 03/17/20 0624 03/18/20 1226 03/19/20 0601 03/20/20 0500  AST 37 25 26 102* 380*  ALT 25 19 23  76* 311*  ALKPHOS 103 91 86 115 210*  BILITOT 1.3* 1.5* 2.2* 0.9 3.0*  PROT 6.3* 6.1* 6.6 6.2* 6.3*  ALBUMIN 2.7* 2.6* 2.5* 2.3* 2.3*     INFECTIOUS Recent Labs  Lab 03/18/20 1226  LATICACIDVEN 1.4     ENDOCRINE CBG (last 3)  Recent Labs    03/19/20 1948 03/20/20 0007 03/20/20 0350  GLUCAP 133* 148* 152*         IMAGING x48h  - image(s) personally visualized  -   highlighted in bold [place] EKG SITE RITE  Result  Date: 03/18/2020 If Site Rite image not attached, placement could not be confirmed due to current cardiac rhythm.

## 2020-03-20 NOTE — Progress Notes (Signed)
    Decision made to intubate the patient after talking to the family because of progressive persistent encephalopathy and respiratory failure.  Patient was intubated upon intubation thick mucus that was dried up was seen around the vocal cords this was removed.  Patient is currently 100% on fentanyl infusion propofol infusion and well sedated.  Plan -We will try to get MRI now that he is under sedation.  If the MRI shows perinephric hematoma then will avoid anticoagulation otherwise based on MRI results we will talk to urology and decide on anticoagulation  Meanwhile we will check blood gas and CBC and chemistry in the next few hours   Additional 45 minutes critical care time   SIGNATURE    Dr. Brand Males, M.D., F.C.C.P,  Pulmonary and Critical Care Medicine Staff Physician, [place] Director - Interstitial Lung Disease  Program  Pulmonary [place] [facility] [place], [place], [address]  Pager: [phone], If no answer  OR between  19:00-7:00h: page [phone] Telephone (clinical office): [phone] Telephone (research): [phone]  5:59 PM 03/20/2020

## 2020-03-20 NOTE — Procedures (Signed)
Intubation Procedure Note  Dhruv Christina  [id number]  March 07, 1956  Date:03/20/20  Time:5:03 PM   Provider Performing:Pete E Kary Kos    Procedure: Intubation (14782)  Indication(s) Respiratory Failure  Consent Risks of the procedure as well as the alternatives and risks of each were explained to the patient and/or caregiver.  Consent for the procedure was obtained and is signed in the bedside chart   Anesthesia Etomidate, Versed, Fentanyl and Rocuronium   Time Out Verified patient identification, verified procedure, site/side was marked, verified correct patient position, special equipment/implants available, medications/allergies/relevant history reviewed, required imaging and test results available.   Sterile Technique Usual hand hygeine, masks, and gloves were used   Procedure Description Patient positioned in bed supine.  Sedation given as noted above.  Patient was intubated with endotracheal tube using Glidescope.  View was Grade 2 only posterior commissure .  Number of attempts was 1.  Colorimetric CO2 detector was consistent with tracheal placement.   Complications/Tolerance None; patient tolerated the procedure well. Chest X-ray is ordered to verify placement.   EBL Minimal   Specimen(s) None  Erick Colace ACNP-BC [place] Pager # [phone] OR # [phone] if no answer

## 2020-03-20 NOTE — Progress Notes (Signed)
Critical ABG results called in to E-link RN. 

## 2020-03-20 NOTE — Progress Notes (Addendum)
eLink Physician-Brief Progress Note Patient Name: Philip Stafford DOB: 08/10/1955 MRN: [id number]   Date of Service  03/20/2020  HPI/Events of Note  Asked to write order for ok to use feeding tube based on X ray done this evening, has already been read by radiology  eICU Interventions  Order placed for OK to use Have asked we be notified when ABG, CBC, BMP are done      Intervention Category Major Interventions: Other: Intermediate Interventions: Diagnostic test evaluation  Oretha MilchSunil G Tonya Wantz 03/20/2020, 8:56 PM   9:25 pm : ABG noted Currently with VT 480, RR 32, peep 10, O2 90 and O2 sat is 98 , no distress Has propofol, fentanyl and precedex infusing  IV bicarb was not started due to lack of IV access Also noted K of 7.2, official full cbc and bmp are pending and are being sent now - will also send lactate K has been rising during the day, avoid any LR  Asked RN to send off labs stat, give the hyperkalemia treatment and get back to us with labs Will decide about repeat check when results done   12:30 am Notified now of labs Delay in resulting labs and RN had to send specimen twice and I was just notified patient had lost IV access so ended up getting hyperkalemia meds around 11:30 pm Will also give lokelma Has AKI on labs with slowly rising potassium all day Repeat ABG and BMP ordered for 1:30 am Will need to consult renal if hyperkalemia and AKI continue to be an issue but am hopeful now that IV bicarb is on, along with hyperkalemia meds that were given, that it continues to improve D/W RN and have asked we be notified immediately when labs result, K is critically high   1:45 am ABG noted VT will be changed to 520 cc which is still less than 8 cc/kb ibw for him Reduce fio2 to 70%,. o2 sat is maintaining at 98 -99 Peak pressure 22-23  D/W RT and next ABG is at 5 am Potassium / bmp pending   2:30 am BMP is still pending but the ABG potassium has resulted as 7.2 Urine  output around 200 cc from 7 pm, on bicarb infusion O2 sat is 96 on 70%/peep 10 and he has stable BP and HR Prior notes mention  A possible perinephric hematoma but MRI abdomen could not be done Hyperkalemia is severe, as is the AKI Will first give the hyperkalemia meds stat and take him for a non contrast CT abd pelvis to evaluate for urinary obstruction  If not, and if K does not improve, will likely need HD Discussed also with bedside PCCM MD Dr Craige CottaSood and given that his oxygenation and hemodynamics are stable for now, will proceed with quick non contrast imaging  Have discussed with RN  Needs to have serial labs done which I ordered as well   3.20 am Slightly lower MAPs thought systolics in 90s 'Hyperkalemia meds being given  Will also write for levophed to initiate via peripheral line for now to allow for safe transport to CT May very well need a central line or HD cath, have also asked bedside MD to evaluate while he is still in ICU  4:45 am  Notified of ABG Improving, hopefully the K will also improve with medical interventions  Was seen by bedside PCCM Is down at CT now  Follow CT and repeat bmp, please call with results when done   6:40 am ABG potassium  improved to 6.4, the venous level is pending and is ordered q4h  Is on 2 mic/min of levophed infusion and otherwise o2 /peep down to 50/8 and sats are 93-94, HR in 70s, in no distress on sedation and with MAP > 70 May need to consult renal and consider central line if continues to need pressors, will order a BMP and ABG for this AM as well (do not see it in orders) Follow official result of CT

## 2020-03-20 NOTE — Procedures (Signed)
Arterial Catheter Insertion Procedure Note  Ludger Bones  [id number]  [birth year]  Date:03/20/20  Time:8:57 PM    Provider Performing: Ulice Dash    Procedure: Insertion of Arterial Line [phone]) with US guidance (96222)   Indication(s) Blood pressure monitoring and/or need for frequent ABGs  Consent Unable to obtain consent due to emergent nature of procedure.  Anesthesia None   Time Out Verified patient identification, verified procedure, site/side was marked, verified correct patient position, special equipment/implants available, medications/allergies/relevant history reviewed, required imaging and test results available.   Sterile Technique Maximal sterile technique including full sterile barrier drape, hand hygiene, sterile gown, sterile gloves, mask, hair covering, sterile ultrasound probe cover (if used).   Procedure Description Area of catheter insertion was cleaned with chlorhexidine and draped in sterile fashion. With real-time ultrasound guidance an arterial catheter was placed into the right radial artery.  Appropriate arterial tracings confirmed on monitor.     Complications/Tolerance None; patient tolerated the procedure well.   EBL Minimal   Specimen(s) None

## 2020-03-21 ENCOUNTER — Inpatient Hospital Stay (HOSPITAL_COMMUNITY): Payer: Medicaid Other

## 2020-03-21 DIAGNOSIS — N179 Acute kidney failure, unspecified: Secondary | ICD-10-CM | POA: Diagnosis not present

## 2020-03-21 DIAGNOSIS — Z452 Encounter for adjustment and management of vascular access device: Secondary | ICD-10-CM

## 2020-03-21 DIAGNOSIS — U071 COVID-19: Secondary | ICD-10-CM | POA: Diagnosis not present

## 2020-03-21 DIAGNOSIS — J80 Acute respiratory distress syndrome: Secondary | ICD-10-CM | POA: Diagnosis not present

## 2020-03-21 DIAGNOSIS — J9620 Acute and chronic respiratory failure, unspecified whether with hypoxia or hypercapnia: Secondary | ICD-10-CM | POA: Diagnosis not present

## 2020-03-21 LAB — BASIC METABOLIC PANEL
Anion gap: 15 (ref 5–15)
BUN: 110 mg/dL — ABNORMAL HIGH (ref 8–23)
CO2: 21 mmol/L — ABNORMAL LOW (ref 22–32)
Calcium: 9.4 mg/dL (ref 8.9–10.3)
Chloride: 119 mmol/L — ABNORMAL HIGH (ref 98–111)
Creatinine, Ser: 3.14 mg/dL — ABNORMAL HIGH (ref 0.61–1.24)
GFR, Estimated: 21 mL/min — ABNORMAL LOW (ref >60)
Glucose, Bld: 224 mg/dL — ABNORMAL HIGH (ref 70–99)
Potassium: 6.6 mmol/L (ref 3.5–5.1)
Sodium: 155 mmol/L — ABNORMAL HIGH (ref 135–145)

## 2020-03-21 LAB — RENAL FUNCTION PANEL
Albumin: 1.7 g/dL — ABNORMAL LOW (ref 3.5–5.0)
Anion gap: 12 (ref 5–15)
BUN: 94 mg/dL — ABNORMAL HIGH (ref 8–23)
CO2: 24 mmol/L (ref 22–32)
Calcium: 8.1 mg/dL — ABNORMAL LOW (ref 8.9–10.3)
Chloride: 115 mmol/L — ABNORMAL HIGH (ref 98–111)
Creatinine, Ser: 2.78 mg/dL — ABNORMAL HIGH (ref 0.61–1.24)
GFR, Estimated: 25 mL/min — ABNORMAL LOW (ref >60)
Glucose, Bld: 171 mg/dL — ABNORMAL HIGH (ref 70–99)
Phosphorus: 6.7 mg/dL — ABNORMAL HIGH (ref 2.5–4.6)
Potassium: 4.8 mmol/L (ref 3.5–5.1)
Sodium: 151 mmol/L — ABNORMAL HIGH (ref 135–145)

## 2020-03-21 LAB — POCT I-STAT 7, (LYTES, BLD GAS, ICA,H+H)
Acid-base deficit: 5 mmol/L — ABNORMAL HIGH (ref 0.0–2.0)
Acid-base deficit: 5 mmol/L — ABNORMAL HIGH (ref 0.0–2.0)
Bicarbonate: 23.2 mmol/L (ref 20.0–28.0)
Bicarbonate: 23.3 mmol/L (ref 20.0–28.0)
Calcium, Ion: 1.37 mmol/L (ref 1.15–1.40)
Calcium, Ion: 1.55 mmol/L (ref 1.15–1.40)
HCT: 32 % — ABNORMAL LOW (ref 39.0–52.0)
HCT: 32 % — ABNORMAL LOW (ref 39.0–52.0)
Hemoglobin: 10.9 g/dL — ABNORMAL LOW (ref 13.0–17.0)
Hemoglobin: 10.9 g/dL — ABNORMAL LOW (ref 13.0–17.0)
O2 Saturation: 99 %
O2 Saturation: 99 %
Patient temperature: 99.1
Potassium: 7.1 mmol/L (ref 3.5–5.1)
Potassium: 6.4 mmol/L (ref 3.5–5.1)
Sodium: 153 mmol/L — ABNORMAL HIGH (ref 135–145)
Sodium: 155 mmol/L — ABNORMAL HIGH (ref 135–145)
TCO2: 25 mmol/L (ref 22–32)
TCO2: 25 mmol/L (ref 22–32)
pCO2 arterial: 60.4 mmHg — ABNORMAL HIGH (ref 32.0–48.0)
pCO2 arterial: 56.8 mmHg — ABNORMAL HIGH (ref 32.0–48.0)
pH, Arterial: 7.195 — CL (ref 7.350–7.450)
pH, Arterial: 7.22 — ABNORMAL LOW (ref 7.350–7.450)
pO2, Arterial: 198 mmHg — ABNORMAL HIGH (ref 83.0–108.0)
pO2, Arterial: 151 mmHg — ABNORMAL HIGH (ref 83.0–108.0)

## 2020-03-21 LAB — COMPREHENSIVE METABOLIC PANEL
ALT: 183 U/L — ABNORMAL HIGH (ref 0–44)
AST: 93 U/L — ABNORMAL HIGH (ref 15–41)
Albumin: 1.9 g/dL — ABNORMAL LOW (ref 3.5–5.0)
Alkaline Phosphatase: 156 U/L — ABNORMAL HIGH (ref 38–126)
Anion gap: 8 (ref 5–15)
BUN: 102 mg/dL — ABNORMAL HIGH (ref 8–23)
CO2: 21 mmol/L — ABNORMAL LOW (ref 22–32)
Calcium: 9.2 mg/dL (ref 8.9–10.3)
Chloride: 124 mmol/L — ABNORMAL HIGH (ref 98–111)
Creatinine, Ser: 2.85 mg/dL — ABNORMAL HIGH (ref 0.61–1.24)
GFR, Estimated: 24 mL/min — ABNORMAL LOW (ref >60)
Glucose, Bld: 186 mg/dL — ABNORMAL HIGH (ref 70–99)
Potassium: 7.1 mmol/L (ref 3.5–5.1)
Sodium: 153 mmol/L — ABNORMAL HIGH (ref 135–145)
Total Bilirubin: 1.4 mg/dL — ABNORMAL HIGH (ref 0.3–1.2)
Total Protein: 5.4 g/dL — ABNORMAL LOW (ref 6.5–8.1)

## 2020-03-21 LAB — CBC WITH DIFFERENTIAL/PLATELET
Abs Immature Granulocytes: 0.06 10*3/uL (ref 0.00–0.07)
Basophils Absolute: 0 10*3/uL (ref 0.0–0.1)
Basophils Relative: 0 %
Eosinophils Absolute: 0 10*3/uL (ref 0.0–0.5)
Eosinophils Relative: 0 %
HCT: 39.4 % (ref 39.0–52.0)
Hemoglobin: 11.5 g/dL — ABNORMAL LOW (ref 13.0–17.0)
Immature Granulocytes: 0 %
Lymphocytes Relative: 3 %
Lymphs Abs: 0.4 10*3/uL — ABNORMAL LOW (ref 0.7–4.0)
MCH: 29.6 pg (ref 26.0–34.0)
MCHC: 29.2 g/dL — ABNORMAL LOW (ref 30.0–36.0)
MCV: 101.5 fL — ABNORMAL HIGH (ref 80.0–100.0)
Monocytes Absolute: 0.7 10*3/uL (ref 0.1–1.0)
Monocytes Relative: 5 %
Neutro Abs: 12.7 10*3/uL — ABNORMAL HIGH (ref 1.7–7.7)
Neutrophils Relative %: 92 %
Platelets: 138 10*3/uL — ABNORMAL LOW (ref 150–400)
RBC: 3.88 MIL/uL — ABNORMAL LOW (ref 4.22–5.81)
RDW: 16.1 % — ABNORMAL HIGH (ref 11.5–15.5)
WBC: 13.9 10*3/uL — ABNORMAL HIGH (ref 4.0–10.5)
nRBC: 0 % (ref 0.0–0.2)

## 2020-03-21 LAB — URINALYSIS, ROUTINE W REFLEX MICROSCOPIC
Bacteria, UA: NONE SEEN
Bilirubin Urine: NEGATIVE
Glucose, UA: NEGATIVE mg/dL
Ketones, ur: NEGATIVE mg/dL
Leukocytes,Ua: NEGATIVE
Nitrite: NEGATIVE
Protein, ur: 30 mg/dL — AB
Specific Gravity, Urine: 1.015 (ref 1.005–1.030)
pH: 5 (ref 5.0–8.0)

## 2020-03-21 LAB — PHOSPHORUS: Phosphorus: 8.6 mg/dL — ABNORMAL HIGH (ref 2.5–4.6)

## 2020-03-21 LAB — PROCALCITONIN: Procalcitonin: 1.38 ng/mL

## 2020-03-21 LAB — GLUCOSE, CAPILLARY
Glucose-Capillary: 137 mg/dL — ABNORMAL HIGH (ref 70–99)
Glucose-Capillary: 149 mg/dL — ABNORMAL HIGH (ref 70–99)
Glucose-Capillary: 157 mg/dL — ABNORMAL HIGH (ref 70–99)
Glucose-Capillary: 177 mg/dL — ABNORMAL HIGH (ref 70–99)
Glucose-Capillary: 180 mg/dL — ABNORMAL HIGH (ref 70–99)

## 2020-03-21 LAB — POTASSIUM
Potassium: 7.4 mmol/L (ref 3.5–5.1)
Potassium: 6.5 mmol/L (ref 3.5–5.1)
Potassium: 5 mmol/L (ref 3.5–5.1)
Potassium: 4.3 mmol/L (ref 3.5–5.1)

## 2020-03-21 LAB — LACTIC ACID, PLASMA
Lactic Acid, Venous: 1.4 mmol/L (ref 0.5–1.9)
Lactic Acid, Venous: 2.5 mmol/L (ref 0.5–1.9)

## 2020-03-21 LAB — TRIGLYCERIDES: Triglycerides: 228 mg/dL — ABNORMAL HIGH (ref <150)

## 2020-03-21 LAB — MAGNESIUM: Magnesium: 3.5 mg/dL — ABNORMAL HIGH (ref 1.7–2.4)

## 2020-03-21 MED ORDER — FAMOTIDINE 20 MG PO TABS: 10.0000 mg | ORAL_TABLET | Freq: Every day | ORAL | Status: DC

## 2020-03-21 MED ORDER — GADOBUTROL 1 MMOL/ML IV SOLN
8.0000 mL | Freq: Once | INTRAVENOUS | Status: AC | PRN
  Administered 2020-03-21: 8 mL via INTRAVENOUS

## 2020-03-21 MED ORDER — PRISMASOL BGK 0/2.5 32-2.5 MEQ/L EC SOLN
Status: DC
  Filled 2020-03-21 (×7): qty 5000

## 2020-03-21 MED ORDER — HEPARIN SODIUM (PORCINE) 1000 UNIT/ML DIALYSIS
1000.0000 [IU] | INTRAMUSCULAR | Status: DC | PRN
  Filled 2020-03-21: qty 6

## 2020-03-21 MED ORDER — PRISMASOL BGK 0/2.5 32-2.5 MEQ/L EC SOLN
Status: DC
  Filled 2020-03-21 (×2): qty 5000

## 2020-03-21 MED ORDER — BARICITINIB 1 MG PO TABS: 1.0000 mg | ORAL_TABLET | Freq: Every day | ORAL | Status: DC

## 2020-03-21 MED ORDER — INSULIN ASPART 100 UNIT/ML IV SOLN
5.0000 [IU] | Freq: Once | INTRAVENOUS | Status: AC
  Administered 2020-03-21: 03:00:00 5 [IU] via INTRAVENOUS

## 2020-03-21 MED ORDER — HEPARIN SODIUM (PORCINE) 1000 UNIT/ML DIALYSIS
1000.0000 [IU] | INTRAMUSCULAR | Status: DC | PRN
  Administered 2020-03-21: 13:00:00 3000 [IU] via INTRAVENOUS_CENTRAL
  Filled 2020-03-21 (×3): qty 6

## 2020-03-21 MED ORDER — ORAL CARE MOUTH RINSE
15.0000 mL | OROMUCOSAL | Status: DC
  Administered 2020-03-21 – 2020-03-22 (×13): 15 mL via OROMUCOSAL

## 2020-03-21 MED ORDER — PRISMASOL BGK 0/2.5 32-2.5 MEQ/L EC SOLN
Status: DC
  Filled 2020-03-21 (×3): qty 5000

## 2020-03-21 MED ORDER — SODIUM ZIRCONIUM CYCLOSILICATE 10 G PO PACK
10.0000 g | PACK | Freq: Once | ORAL | Status: AC
  Administered 2020-03-21: 09:00:00 10 g
  Filled 2020-03-21: qty 1

## 2020-03-21 MED ORDER — PRISMASOL BGK 4/2.5 32-4-2.5 MEQ/L EC SOLN
Status: DC
  Filled 2020-03-21 (×11): qty 5000

## 2020-03-21 MED ORDER — ALBUTEROL SULFATE (2.5 MG/3ML) 0.083% IN NEBU
10.0000 mg | INHALATION_SOLUTION | Freq: Once | RESPIRATORY_TRACT | Status: AC
  Administered 2020-03-21: 03:00:00 10 mg via RESPIRATORY_TRACT
  Filled 2020-03-21: qty 12

## 2020-03-21 MED ORDER — MIDAZOLAM HCL 2 MG/2ML IJ SOLN
INTRAMUSCULAR | Status: AC
  Administered 2020-03-21: 13:00:00 2 mg via INTRAVENOUS
  Filled 2020-03-21: qty 4

## 2020-03-21 MED ORDER — SODIUM CHLORIDE 0.9 % IV SOLN
1.0000 g | Freq: Once | INTRAVENOUS | Status: AC
  Administered 2020-03-21: 03:00:00 1 g via INTRAVENOUS
  Filled 2020-03-21: qty 10

## 2020-03-21 MED ORDER — SODIUM CHLORIDE 0.9 % IV SOLN
250.0000 mL | INTRAVENOUS | Status: DC
  Administered 2020-03-21: 04:00:00 250 mL via INTRAVENOUS

## 2020-03-21 MED ORDER — PRISMASOL BGK 4/2.5 32-4-2.5 MEQ/L REPLACEMENT SOLN
Status: DC
  Filled 2020-03-21 (×3): qty 5000

## 2020-03-21 MED ORDER — NOREPINEPHRINE 4 MG/250ML-% IV SOLN
2.0000 ug/min | INTRAVENOUS | Status: DC
  Administered 2020-03-21 (×2): 2 ug/min via INTRAVENOUS
  Administered 2020-03-22: 14:00:00 3 ug/min via INTRAVENOUS
  Filled 2020-03-21 (×2): qty 250

## 2020-03-21 MED ORDER — SODIUM CHLORIDE 0.9 % IV SOLN
2.0000 g | Freq: Once | INTRAVENOUS | Status: AC
  Administered 2020-03-21: 12:00:00 2 g via INTRAVENOUS

## 2020-03-21 MED ORDER — METRONIDAZOLE IN NACL 5-0.79 MG/ML-% IV SOLN
500.0000 mg | Freq: Three times a day (TID) | INTRAVENOUS | Status: DC
  Administered 2020-03-21 – 2020-03-22 (×4): 500 mg via INTRAVENOUS
  Filled 2020-03-21 (×4): qty 100

## 2020-03-21 MED ORDER — HEPARIN (PORCINE) 2000 UNITS/L FOR CRRT: INTRAVENOUS_CENTRAL | Status: DC | PRN

## 2020-03-21 MED ORDER — CHLORHEXIDINE GLUCONATE 0.12% ORAL RINSE (MEDLINE KIT)
15.0000 mL | Freq: Two times a day (BID) | OROMUCOSAL | Status: DC
  Administered 2020-03-21 – 2020-03-22 (×3): 15 mL via OROMUCOSAL

## 2020-03-21 MED ORDER — ALBUTEROL SULFATE (2.5 MG/3ML) 0.083% IN NEBU
2.5000 mg | INHALATION_SOLUTION | RESPIRATORY_TRACT | Status: DC
  Administered 2020-03-21 – 2020-03-22 (×8): 2.5 mg via RESPIRATORY_TRACT
  Filled 2020-03-21 (×8): qty 3

## 2020-03-21 MED ORDER — IPRATROPIUM-ALBUTEROL 0.5-2.5 (3) MG/3ML IN SOLN
3.0000 mL | Freq: Four times a day (QID) | RESPIRATORY_TRACT | Status: DC
  Administered 2020-03-21: 08:00:00 3 mL via RESPIRATORY_TRACT
  Filled 2020-03-21: qty 3

## 2020-03-21 MED ORDER — DEXTROSE 5 % IV SOLN: INTRAVENOUS | Status: DC

## 2020-03-21 MED ORDER — HEPARIN (PORCINE) 25000 UT/250ML-% IV SOLN
1550.0000 [IU]/h | INTRAVENOUS | Status: DC
  Administered 2020-03-21: 20:00:00 1250 [IU]/h via INTRAVENOUS
  Administered 2020-03-22: 10:00:00 1500 [IU]/h via INTRAVENOUS
  Filled 2020-03-21 (×3): qty 250

## 2020-03-21 MED ORDER — SODIUM ZIRCONIUM CYCLOSILICATE 10 G PO PACK
10.0000 g | PACK | Freq: Once | ORAL | Status: AC
  Administered 2020-03-21: 01:00:00 10 g
  Filled 2020-03-21: qty 1

## 2020-03-21 MED ORDER — LACTATED RINGERS IV BOLUS
1000.0000 mL | Freq: Once | INTRAVENOUS | Status: AC
  Administered 2020-03-21: 08:00:00 1000 mL via INTRAVENOUS

## 2020-03-21 MED ORDER — SODIUM CHLORIDE 0.9 % IV SOLN
2.0000 g | INTRAVENOUS | Status: DC
  Filled 2020-03-21: qty 2

## 2020-03-21 MED ORDER — SODIUM CHLORIDE 0.9 % IV SOLN
2.0000 g | Freq: Two times a day (BID) | INTRAVENOUS | Status: DC
  Administered 2020-03-22 (×2): 2 g via INTRAVENOUS
  Filled 2020-03-21 (×2): qty 2

## 2020-03-21 MED ORDER — MIDAZOLAM HCL 2 MG/2ML IJ SOLN: 2.0000 mg | Freq: Once | INTRAMUSCULAR | Status: AC

## 2020-03-21 MED ORDER — PRISMASOL BGK 4/2.5 32-4-2.5 MEQ/L REPLACEMENT SOLN
Status: DC
  Filled 2020-03-21 (×4): qty 5000

## 2020-03-21 MED ORDER — SODIUM BICARBONATE 8.4 % IV SOLN
100.0000 meq | Freq: Once | INTRAVENOUS | Status: AC
  Administered 2020-03-21: 09:00:00 100 meq via INTRAVENOUS
  Filled 2020-03-21: qty 100

## 2020-03-21 MED ORDER — ALBUTEROL SULFATE (2.5 MG/3ML) 0.083% IN NEBU: 2.5000 mg | INHALATION_SOLUTION | RESPIRATORY_TRACT | Status: DC

## 2020-03-21 NOTE — Progress Notes (Signed)
CRITICAL VALUE ALERT  Critical Value: K-6.5  Date & Time Notied:  03/21/2020 0754  Provider Notified: Dr. Chase Caller  Orders Received/Actions taken: No new orders at this time.  Irven Baltimore, RN

## 2020-03-21 NOTE — Progress Notes (Signed)
PHARMACY NOTE:  ANTIMICROBIAL RENAL DOSAGE ADJUSTMENT  Current antimicrobial regimen includes a mismatch between antimicrobial dosage and estimated renal function.  As per policy approved by the Pharmacy & Therapeutics and Medical Executive Committees, the antimicrobial dosage will be adjusted accordingly.  Current antimicrobial dosage:  baricitinib 2mg  daily   Indication: COVID  Renal Function:  Estimated Creatinine Clearance: 28.1 mL/min (A) (by C-G formula based on SCr of 2.85 mg/dL (H)).     Antimicrobial dosage has been changed to:  baricitinib 1mg  daily   Additional comments:   Thank you for allowing pharmacy to be a part of this patient's care.  Cristela Felt, PharmD Clinical Pharmacist  03/21/2020 6:54 AM

## 2020-03-21 NOTE — Progress Notes (Signed)
Pt transported from 3M09 to CT and back with no complications.

## 2020-03-21 NOTE — Progress Notes (Addendum)
Pharmacy Antibiotic Note  Philip Stafford is a 64 y.o. male admitted on 03/05/2020 with fever after recent COVID diagnosis.  Critical care was consulted for increas oxygen needs and patient was intubated on 03/20/2020. Pharmacy has been consulted for cefepime dosing for sepsis. WBC 13.9. Tmax 100.5. CrCl 28 mL/min  Plan: Start cefepime 2g q24h  Monitor renal function, cultures, and clinical progression   Height: 5\' 7"  (170.2 cm) Weight: 90.3 kg (199 lb 1.2 oz) IBW/kg (Calculated) : 66.1  Temp (24hrs), Avg:98.8 F (37.1 C), Min:96.8 F (36 C), Max:100.5 F (38.1 C)  Recent Labs  Lab 03/18/20 1226 03/19/20 0601 03/20/20 0500 03/20/20 2153 03/20/20 2307 03/21/20 0119 03/21/20 0527 03/21/20 0733  WBC 16.8* 17.1* 20.3*  --  15.2*  --   --  13.9*  CREATININE 1.54* 1.45* 1.82* 2.56*  --  2.85*  --   --   LATICACIDVEN 1.4  --   --  1.0  --   --  1.4  --     Estimated Creatinine Clearance: 28.1 mL/min (A) (by C-G formula based on SCr of 2.85 mg/dL (H)).    No Known Allergies  Antimicrobials this admission: Azithromycin 12/16 >> 12/20 Ceftriaxone 12/16 >> 12/20  Dose adjustments this admission: N/a  Microbiology results: 12/16 bcx: negative  12/25 bcx: 12/25 resp:   Thank you for allowing pharmacy to be a part of this patient's care.  Cristela Felt, PharmD Clinical Pharmacist  03/21/2020 8:19 AM    Addendum: Patient is starting CRRT today. Will adjust cefepime to 2g q12h.   Cristela Felt, PharmD Clinical Pharmacist

## 2020-03-21 NOTE — Procedures (Signed)
Central Venous Catheter Insertion Procedure Note  Philip Stafford  [id number]  08-Nov-1955  Date:03/21/20  Time:12:52 PM   Provider Performing:Pete Johnette Abraham Kary Kos   Procedure: Insertion of Non-tunneled Central Venous [phone]) with US guidance ([id number])   Indication(s) Hemodialysis  Consent Risks of the procedure as well as the alternatives and risks of each were explained to the patient and/or caregiver.  Consent for the procedure was obtained and is signed in the bedside chart  Anesthesia Topical only with 1% lidocaine   Timeout Verified patient identification, verified procedure, site/side was marked, verified correct patient position, special equipment/implants available, medications/allergies/relevant history reviewed, required imaging and test results available.  Sterile Technique Maximal sterile technique including full sterile barrier drape, hand hygiene, sterile gown, sterile gloves, mask, hair covering, sterile ultrasound probe cover (if used).  Procedure Description Area of catheter insertion was cleaned with chlorhexidine and draped in sterile fashion.  With real-time ultrasound guidance a HD catheter was placed into the right internal jugular vein. Nonpulsatile blood flow and easy flushing noted in all ports.  The catheter was sutured in place and sterile dressing applied.  Complications/Tolerance None; patient tolerated the procedure well. Chest X-ray is ordered to verify placement for internal jugular or subclavian cannulation.   Chest x-ray is not ordered for femoral cannulation.  EBL Minimal  Specimen(s) None Erick Colace ACNP-BC [place] Pager # [phone] OR # [phone] if no answer

## 2020-03-21 NOTE — Progress Notes (Signed)
Asked to assess pt with hyperkalemia, acidosis and hypotension.  Started on HCO3 gtt earlier in the evening.  Pressor needs have improved and on 2 mcg of levophed at this time.  F/u ABG shows improved pH and oxygenation.  Decreased PEEP and FiO2.  About 300 ml of urine outpt thus far for this shift.  There has been question about perinephric hematoma.  Unable to get MRI abdomen done.  Going for CT abdomen now.  Will await CT abdomen results and repeat BMET results.  Will then discuss with daytime rounding team to determine if he might need nephrology consult if acidosis and hyperkalemia persist.  Chesley Mires, MD [place] Pager - [phone] 03/21/2020, 4:42 AM

## 2020-03-21 NOTE — Progress Notes (Signed)
Patient transported to MRI and back without complications. RN at bedside. 

## 2020-03-21 NOTE — Progress Notes (Signed)
Called by primary team that MRI abx w/ and w/o completed today. 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 Also present are numerous widespread osseus met disease.   [place] for anticoagulation from GU standpoint. Recommend CT guided right kidney biopsy when able and med-onc consultation to discuss systemic therapy options. He is high risk per Motzer criteria with estimated median survival approximately 4 months per nomogram. He would unlikely benefit from cytoreductive nephrectomy given the extensive osseous metastatic disease.  Matt R. [facility]  Pager: [phone]

## 2020-03-21 NOTE — Progress Notes (Signed)
ANTICOAGULATION CONSULT NOTE - Initial Consult  Pharmacy Consult for heparin Indication: DVT  No Known Allergies  Patient Measurements: Height: 5\' 7"  (170.2 cm) Weight: 90.3 kg (199 lb 1.2 oz) IBW/kg (Calculated) : 66.1   Temp: 93.4 F (34.1 C) (12/25 1700) Temp Source: Rectal (12/25 1700) Pulse Rate: 63 (12/25 1730)  Labs: Recent Labs    03/20/20 0500 03/20/20 1428 03/20/20 2307 03/21/20 0119 03/21/20 0139 03/21/20 0422 03/21/20 0733 03/21/20 1558  HGB 14.8   < > 12.5*  --  10.9* 10.9* 11.5*  --   HCT 45.7   < > 41.1  --  32.0* 32.0* 39.4  --   PLT 221  --  148*  --   --   --  138*  --   CREATININE 1.82*   < >  --  2.85*  --   --  3.14* 2.78*   < > = values in this interval not displayed.    Estimated Creatinine Clearance: 28.8 mL/min (A) (by C-G formula based on SCr of 2.78 mg/dL (H)).   Medical History: History reviewed. No pertinent past medical history.    Assessment: 64yom admitted with COVID pneumonia.  Now found to have LLE DVT.  Anticoagulation initially held d/t concern for nephritic hematoma > found to renal cell carcinoma.   Ok to begin anticoagulation.   Goal of Therapy:  Heparin level 0.3-0.7 units/ml Monitor platelets by anticoagulation protocol: Yes   Plan:  Heparin drip 1250 uts/hr  Draw heparin level in 6hr  Daily HL and CBC     Bonnita Nasuti Pharm.D. CPP, BCPS Clinical Pharmacist [phone] 03/21/2020 6:55 PM

## 2020-03-21 NOTE — Progress Notes (Signed)
NAME:  Parminder Forton, MRN:  [id number], DOB:  [birth year], LOS: 8 ADMISSION DATE:  03/08/2020, CONSULTATION DATE:  03/21/20 REFERRING MD:  Charleen Kirks, CHIEF COMPLAINT:  Hypoxia   Brief History:   Mr. Likes is a 64 year old male who does not follow with primary care and has a history of 70 pack years of tobacco use  (quit 11 yrs ago) who presents with fever up to 102F at home.  He had been diagnosed with Covid-19 approximately 4 days ago.  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  He has had some cough and emesis.  He was prescribed azithromycin by urgent care.  On arrival to the ED his oxygen saturations were very low, 38% though patient was awake so unlikely to be accurate.  He was placed on nonrebreather.  Work-up was significant for likely left upper lobe pneumonia and diffuse interstitial thickening with elevated inflammatory markers and a creatinine of 4.6.  Oxygen requirements increased to 40 L with PO2 of 48 on ABG, so PCCM was consulted.  On evaluation, reportedly patient's oxygen tank had run out at the time of a prior ABG.  He is currently awake and alert on 15 L nonrebreather without any respiratory distress or complaints.  Past Medical History:  Tobacco use   has no past medical history on file.  has no past surgical history on file.   There is no immunization history on file for this patient.   Significant Hospital Events:  12/16 admit to internal medicine PCCM consult  12/17 Re-consult PCCM. Found to have R renal mass, favored to be perinephric hematoma, as well as acute DVT LLE. Not anticoagulated due to hematoma   12/20 Re-consult PCCM-- worsening hypoxia and AMS - More confused today-- reports of pt walking around room naked, taking off supplemental O2 etc . Increased O2 requirements:  HHFNC 40L and 1.0 NRB  ABG: 7.45/27.3/59.1 SpO2 94%  Reportedly desats quickly when O2 taken off or when moves.   Ddimer has been >20 since 12/19   CRP rising, is now 4.2 from 1.2 Ferritin 904   12/21 - ongoing confusion and agitation despite precedex gtt. On face mask with HHFNC and pulse ox 86% . RT says too agitated to tolerate biPAP. Currently moaning he wants to "go home now". Think he is in [facility]   12/22 - 12/22 - On HHFNC and face mask. 60KL and 100%. Still confused and agitated. Redirectable. On percedex gtt and scheeduled haldol. On Posey. QTc 509 msec. MRI abdoment and IVC filter still on hold due to encephalopathy. Midline not working. RN recommends PICC  12/23 0- 90% fiop2, 45L flow, Face mask +. Precedex + . Patient admitted to etoh behavior + but daughter thinks this is cofnusion. Daughter says no way patient is drinking eoth since 2009.  NA 152 . Creat improving. PICC pending  - order +  . Haldol n hold due to long QTc yesterday, Toleating TF +  12/24 -  briefly used bipap yesterda morning. On precedex gtt. Fio2 100% on 55L HHFNC. -> overnight -> t his am reduced to 80% and 45L HHFNC CXR today same v better. Haldol on hold->  QTc improved to 446 msec  yesterday on ekg . On CIWA protocol since last night.  -. INTUBATED  Consults:  PCCM Renal 12/25  Procedures:  Midline ? Date ?? - came out ? 03/19/20 PICC ordered 12/22  cortrak 12/22 ETT 12/24 -  R radial a  line 12/24 -    Significant Diagnostic Tests:  TTE 12/17> LVEF 60-65% normal LV function. Normal RV size and systolic function.  LE dopplers venous - LLE DVT  12/16 CT a/p noncon> extensive GGO. 2 pulm nodules at bilateral lung bases up to 60mm. Moderate to large perinephric collection, suspected hematoma. Mild R hydronephrosis    IMPRESSION: 1. Asymmetrically enlarged right kidney, suspect moderate to large right perinephric collection. Collection appears slightly dense suggestive of perirenal hematoma though further characterisation is limited without contrast. There also appears to be mild right hydronephrosis. There is no ureteral stone. 2.  Extensive ground-glass density at the bilateral lung bases, favored to represent pneumonia with other differential considerations including edema and hemorrhage. There are 2 pulmonary nodules at the bilateral lung bases measuring up to 12 mm on the left, nodules could be infectious, inflammatory, or neoplastic in etiology. 3. Sigmoid colon diverticular disease without acute inflammatory change. 4. Aortic atherosclerosis.  Aortic Atherosclerosis (ICD10-I70.0).  Electronically Signed: By: Donavan Foil M.D. xxxxx   Micro Data:  12/16 Covid-19>> positive xxx 12/25 - blood 12/25 ua 12/25 - resp   Antimicrobials:   azithromycin 12/16-12/20 Ceftriaxone 12/16-12/20  xxx covid Rx - decadrom 12/17 -  12/25 (? Gi perf)_ - barcitinib ? Start date - 12/25 (CT abd with evidence of diverticulituis +/- micropef_ xxxxx CEfepime 12/25 falgyl 12/25  Interim History / Subjective:    03/21/2020 - 50% fio2,/ 8 peep  Supine - on vent. . On diprivan, precedex gtt, fent gtt, levophed gtt low dose 1 mcg/min. Febrile overnight. WBC high but improved. Repeat CT 12/25 with possible sigmoid colon diveritculitis with micropeforation.   On CT: Rt Kidney still abnormal - bleed hematoma v renal cel cancer. MRI pending later today. Acidotic on bic gtt. UR op dropping. Hyperkalemic  Objective   Blood pressure (!) 86/56, pulse 80, temperature (!) 96.8 F (36 C), temperature source Axillary, resp. rate (!) 27, height 5\' 7"  (1.702 m), weight 90.3 kg, SpO2 94 %.    Vent Mode: PRVC FiO2 (%):  [50 %-100 %] 50 % Set Rate:  [24 bmp-32 bmp] 32 bmp Vt Set:  [460 mL-520 mL] 520 mL PEEP:  [8 cmH20-10 cmH20] 8 cmH20 Plateau Pressure:  [17 cmH20-22 cmH20] 17 cmH20   Intake/Output Summary (Last 24 hours) at 03/21/2020 0756 Last data filed at 03/21/2020 0747 Gross per 24 hour  Intake 2222.7 ml  Output 1220 ml  Net 1002.7 ml   Filed Weights   03/19/20 0500 03/20/20 0500 03/21/20 0500  Weight: 87.8 kg  84.5 kg 90.3 kg    General Appearance:  Looks criticall ill OBESE - no Head:  Normocephalic, without obvious abnormality, atraumatic Eyes:  PERRL - yes, conjunctiva/corneas - muddy     Ears:  Normal external ear canals, both ears Nose:  G tube - yes Throat:  ETT TUBE - yes , OG tube - no Neck:  Supple,  No enlargement/tenderness/nodules Lungs: Clear to auscultation bilaterally, Ventilator   Synchrony - yes, 40% fio2, peep 8 Heart:  S1 and S2 normal, no murmur, CVP - x.  Pressors - levophed 77mc Abdomen:  Soft, no masses, no organomegaly. No ridigity Genitalia / Rectal:  Not done Extremities:  Extremities- intact Skin:  ntact in exposed areas . Sacral area - not examined Neurologic:  Sedation - fent gtt, precedex gtt, diprivan just off -> RASS - -4    Resolved Hospital Problem list     Assessment & Plan:  [id number]  -03/21/20 on barcitinb  of day 7, and decadron day 9 of 10  Plan  - dc both in view of CT abd suggesting diverticulitis with micro perf   Hx of heavy smoking at baseline but no known resp dissues Acute hypoxemic respiratory failure 2/2 COVID-19 PNA - s/p intubation 12/24   03/21/2020 - > does not meet criteria for SBT/Extubation in setting of Acute Respiratory Failure due to shock, renal failure, acidoosis and sedation    Plan  -  PRVC - vAP bundle   Confirmed LLE DVT with concern for possible PE with -has not started on systemic anticoag or on vte ppx  in setting of suspected renal perinephirc hematoma below  known DVT, worsening hypoxia.  -LLE DVT 12/16 -ECHO 12/17 without evidence of R heart failure. Has not gone for CTA first because of AKI,  -has not been on VTE ppx or systemic anticoagulation given perinephric hematoma.   03/21/2020  Now that he is intubated IVC filter possible  P - monitor without anticoagulation (per urology 12/20.21 note -> if anticoagulation definitelyu needed - ok to sart anticoagulation but needs IVC filter if bleeds into  hematoma. CCM will not take risk giving anticoag at this point -> will order IVC filter when able )  - CCM wil lget MRI abd - if lesion I smore indicative of renal cell cancer as opposed to hematoma then will d/w rena;/urollogy and start systemic anticoag. If concern for bleed-> then IVC filter   Suspected perinephric hematoma  -seen by urology and nephrology  03/21/2020 - CT abd now calling for renal cell ca in ddx  Plan  -await MRI 12/25  Acute encephalopathy -suspect 2/2 hypoxia, possible delirium, CNS depressing meds -is at risk for CVA with known DVT, hypercoagulable state   03/21/2020 -now sedated on ventilator with 3 agents  P  - precedex gtt - dc diprivan gtt  - continue fent gtt  - no need for haldol - RASS goal 0 to -3  AKI Metabolic acidosis Hyperkalemia  03/21/2020 post intubation has severe metabolic acidosis with worsening renal failure and hyperkalemia  plan -cotninue bic gtt  - bic bolus - lokelma x 1 - alb neb x 1 - Place HD and start CRRT (d/w renal)   Other electrolyes  03/21/2020 - Na high  Plan  - change free water to d5w  Concern for diveritculitis on CT 03/21/20  03/21/2020 - abd is soft and tolerting TF b =  Plan Stop barcitinib stop TF Stop all laxatives Start cefepime  sart flagyl Ok for lokelma x 1 due to high K Dc po free water - start D5w   Circulatry shock  ,03/21/2020 - ssupect sepsis and post intubation and acidemia. Currently levophed 36mcg  Plan  - fluid bolus - levophe for MAP goal > 65  Anemia of critical lillness  03/21/2020 - no active bleed  Plan - - PRBC for hgb </= 6.9gm%    - exceptions are   -  if ACS susepcted/confirmed then transfuse for hgb </= 8.0gm%,  or    -  active bleeding with hemodynamic instability, then transfuse regardless of hemoglobin value   At at all times try to transfuse 1 unit prbc as possible with exception of active hemorrhage    Goals of Care:   12/17 PCCM attempted  [facility] conversation-- patient was reportedly distant and when asked about his feelings if condition were to worsen said he just hopes he gets better. Remains full code  12/21 -  Called daughter Richmond Campbell  Scales [phone] Hassan Rowan Gorden Harms is girl friend - per daughter Hassan Rowan should not get information because of communication barrier MD -> layperson term).   12/22 - updated  12/23 - updated  12/24 - updated daughter indicated heading towards intubation . She indicated full code  12/25 - updated daughter. Given extensive update about MRI., CRRT - and holding feeds. She wants full active care. Discussed code status in case of arrest-> she indicated full medical care but agreed to no CPR in case of cardiac arrest. Consented for HD cath  [facility]   Diet: NPO, sips with meds -> cortrak 03/18/2020 and then TF PAD: n/a VAP: n/a  DVT ppx: no scd 2/2 le dvt and no chemoprophy 2/2 bleed GI ppx: pepcid (on steroids)  Glucose monitoring: monitoring Mobility: encourage self prone  Family Update:\  daughter Richmond Campbell Scales [phone] Hassan Rowan Gorden Harms is girl friend and per daughter - Hassan Rowan should not get information because of communication barrier MD -> layperson term).  Code Status: Full -  Dispo: ICU under CCM (IMTS when out of ICU) service     [place]   The patient Deantae Riedemann is critically ill with multiple organ systems failure and requires high complexity decision making for assessment and support, frequent evaluation and titration of therapies, application of advanced monitoring technologies and extensive interpretation of multiple databases.   Critical Care Time devoted to patient care services described in this note is  60  Minutes. This time reflects time of care of this signee Dr Brand Males. This critical care time does not reflect procedure time, or teaching time or supervisory time of PA/NP/Med student/Med Resident etc but could involve care discussion time      Dr. Brand Males, M.D., [facility].C.P Pulmonary and Critical Care Medicine Staff Physician [facility] Pager: [phone], If no answer or between  15:00h - 7:00h: call [phone]  03/21/2020 8:18 AM       LABS    PULMONARY Recent Labs  Lab 03/20/20 1428 03/20/20 1816 03/20/20 2101 03/21/20 0139 03/21/20 0422  PHART 7.360 7.110* 7.191* 7.195* 7.220*  PCO2ART 29.7* 73.7* 57.9* 60.4* 56.8*  PO2ART 63* 111* 173* 198* 151*  HCO3 16.8* 23.4 22.2 23.2 23.3  TCO2 18* 26 24 25 25   O2SAT 91.0 96.0 99.0 99.0 99.0    CBC Recent Labs  Lab 03/19/20 0601 03/20/20 0500 03/20/20 1428 03/20/20 2307 03/21/20 0139 03/21/20 0422  HGB 12.9* 14.8   < > 12.5* 10.9* 10.9*  HCT 39.5 45.7   < > 41.1 32.0* 32.0*  WBC 17.1* 20.3*  --  15.2*  --   --   PLT 153 221  --  148*  --   --    < > = values in this interval not displayed.    COAGULATION No results for input(s): INR in the last 168 hours.  CARDIAC  No results for input(s): TROPONINI in the last 168 hours. No results for input(s): PROBNP in the last 168 hours.   CHEMISTRY Recent Labs  Lab 03/18/20 1226 03/19/20 0601 03/20/20 0500 03/20/20 1428 03/20/20 2101 03/20/20 2153 03/21/20 0119 03/21/20 0139 03/21/20 0256 03/21/20 0422 03/21/20 0527  NA 152* 152* 155*   < > 154* 154* 153* 153*  --  155*  --   K 5.0 5.2* 6.0*   < > 7.2* >7.5* 7.1* 7.1*   < > 6.4* 6.5*  CL 121* 124* 126*  --   --  126* 124*  --   --   --   --   CO2 18* 18* 17*  --   --  21* 21*  --   --   --   --   GLUCOSE 148* 168* 147*  --   --  153* 186*  --   --   --   --   BUN 59* 69* 79*  --   --  96* 102*  --   --   --   --   CREATININE 1.54* 1.45* 1.82*  --   --  2.56* 2.85*  --   --   --   --   CALCIUM 9.0 8.9 9.2  --   --  8.6* 9.2  --   --   --   --   MG 3.2* 3.4* 3.2*  --   --   --  3.5*  --   --   --   --   PHOS 4.6 4.1 3.4  --   --   --  8.6*  --   --   --   --    < > = values in this  interval not displayed.   Estimated Creatinine Clearance: 28.1 mL/min (A) (by C-G formula based on SCr of 2.85 mg/dL (H)).   LIVER Recent Labs  Lab 03/17/20 0624 03/18/20 1226 03/19/20 0601 03/20/20 0500 03/21/20 0119  AST 25 26 102* 380* 93*  ALT 19 23 76* 311* 183*  ALKPHOS 91 86 115 210* 156*  BILITOT 1.5* 2.2* 0.9 3.0* 1.4*  PROT 6.1* 6.6 6.2* 6.3* 5.4*  ALBUMIN 2.6* 2.5* 2.3* 2.3* 1.9*     INFECTIOUS Recent Labs  Lab 03/18/20 1226 03/20/20 2153 03/21/20 0527  LATICACIDVEN 1.4 1.0 1.4     ENDOCRINE CBG (last 3)  Recent Labs    03/20/20 2344 03/21/20 0301 03/21/20 0731  GLUCAP 216* 157* 180*         IMAGING x48h  - image(s) personally visualized  -   highlighted in bold CT ABDOMEN PELVIS WO CONTRAST  Result Date: 03/21/2020 CLINICAL DATA:  Acute renal failure. EXAM: CT ABDOMEN AND PELVIS WITHOUT CONTRAST TECHNIQUE: Multidetector CT imaging of the abdomen and pelvis was performed following the standard protocol without IV contrast. COMPARISON:  03/03/2020 FINDINGS: Lower chest: The diffuse interstitial and alveolar ground-glass opacity seen previously shows progression in the dependent lung bases with more confluent consolidative opacity today. Hepatobiliary: No focal abnormality in the liver on this study without intravenous contrast. There is no evidence for gallstones, gallbladder wall thickening, or pericholecystic fluid. No intrahepatic or extrahepatic biliary dilation. Pancreas: No focal mass lesion. No dilatation of the main duct. No intraparenchymal cyst. No peripancreatic edema. Spleen: No splenomegaly. No focal mass lesion. Adrenals/Urinary Tract: No adrenal nodule or mass. Abnormal appearance of right kidney is stable in the interval. Left kidney unremarkable. No hydroureteronephrosis. Bladder decompressed by Foley catheter. Stomach/Bowel: Stomach is nondistended. Feeding catheter is identified with tip transpyloric in the second portion of the  duodenum. No small bowel wall thickening. No small bowel dilatation. The terminal ileum is normal. The appendix is not visualized, but there is no edema or inflammation in the region of the cecum. Diverticular changes are most prominent in the left colon with interval development of focal wall thickening in the mid sigmoid segment associated with pericolonic edema/inflammation. Imaging features are most suggestive of diverticulitis. There is some edema in the sigmoid mesocolon without rim enhancing/organized abscess at this time. Tiny gas bubbles  seen along the posterior wall of the sigmoid colon on image 75/3 are probably in folds or diverticuli, although contained micro perforation not entirely excluded. Vascular/Lymphatic: There is abdominal aortic atherosclerosis without aneurysm. There is no gastrohepatic or hepatoduodenal ligament lymphadenopathy. No retroperitoneal or mesenteric lymphadenopathy. No pelvic sidewall lymphadenopathy. Reproductive: The prostate gland and seminal vesicles are unremarkable. Other: No intraperitoneal free fluid. Musculoskeletal: No worrisome lytic or sclerotic osseous abnormality. IMPRESSION: 1. Interval development of focal wall thickening in the mid sigmoid colon with pericolonic edema/inflammation. Imaging features are most suggestive of diverticulitis. Tiny gas bubbles seen along the posterior wall of the sigmoid colon are probably intraluminal although contained micro perforation not entirely excluded. No evidence for abscess. 2. Interval progression of diffuse interstitial and alveolar ground-glass opacity seen previously with more confluent consolidative opacity today. Imaging features are compatible with worsening edema or pneumonia. 3. Stable abnormal masslike distortion of the right kidney. This may be related to subcapsular hematoma although renal cell carcinoma could certainly have this appearance. Close follow-up recommended. 4. No evidence for hydroureteronephrosis in  this patient with worsening renal failure. 5. Aortic Atherosclerosis (ICD10-I70.0). Electronically Signed   By: Kennith Center M.D.   On: 03/21/2020 05:57   DG Abd 1 View  Result Date: 03/20/2020 CLINICAL DATA:  Enteric tube placement EXAM: ABDOMEN - 1 VIEW COMPARISON:  None. FINDINGS: The enteric tube tip projects over the gastric antrum/pylorus. The tip is pointed distally. The visualized bowel gas pattern is nonobstructive. IMPRESSION: Enteric tube projects over the gastric antrum/pylorus. Electronically Signed   By: Katherine Mantle M.D.   On: 03/20/2020 17:36   DG CHEST PORT 1 VIEW  Result Date: 03/21/2020 CLINICAL DATA:  Endotracheal tube EXAM: PORTABLE CHEST 1 VIEW COMPARISON:  Yesterday FINDINGS: Endotracheal tube with tip halfway between the clavicular heads and carina. Feeding tube that at least reaches the stomach. Bilateral airspace disease. Hyperinflation and emphysema. No visible effusion or pneumothorax. Normal heart size. IMPRESSION: 1. Stable hardware positioning. 2. Stable pneumonia superimposed on emphysema. Electronically Signed   By: Marnee Spring M.D.   On: 03/21/2020 07:12   DG CHEST PORT 1 VIEW  Result Date: 03/20/2020 CLINICAL DATA:  Intubation EXAM: PORTABLE CHEST 1 VIEW COMPARISON:  Chest x-ray 12242 5:26 a.m. FINDINGS: Interval placement of endotracheal tube with tip terminating 7 cm above the carina. An enteric tube is noted to course below the diaphragm with tip collimated off view. The guidewire is noted still present within the enteric tube. The heart size and mediastinal contours are within normal limits. Persistent bilateral patchy airspace opacities. No pulmonary edema. No pleural effusion. No pneumothorax. No acute osseous abnormality. IMPRESSION: 1. Interval placement of an endotracheal tube with tip 7 cm above the carina. Consider advancing by 1-2 cm. 2. Similar appearing multifocal pneumonia. Electronically Signed   By: Tish Frederickson M.D.   On: 03/20/2020  17:37   DG CHEST PORT 1 VIEW  Result Date: 03/20/2020 CLINICAL DATA:  Acute on chronic respiratory failure. EXAM: PORTABLE CHEST 1 VIEW COMPARISON:  03/16/2020 FINDINGS: 0526 hours. Cardiopericardial silhouette is at upper limits of normal for size. Interstitial markings are diffusely coarsened with chronic features. The bibasilar interstitial and patchy airspace disease is similar to prior. No pleural effusion. Old right clavicle fracture. Telemetry leads overlie the chest. A feeding tube passes into the stomach although the distal tip position is not included on the film. IMPRESSION: Stable exam. Bibasilar interstitial and patchy airspace disease. Electronically Signed   By: Jamison Oka.D.  On: 03/20/2020 09:06

## 2020-03-21 NOTE — Consult Note (Signed)
[facility] Renal Consultation Note  Requesting MD: Ramaswamy Indication for Consultation: A on CKD  HPI:  Philip Stafford is a 64 y.o. male with no consistent medical care- smoker who was not vaccinated for COVID. He presented to the hospital on 12/16 with hypoxia- initial crt also 4.69 - had renal u/s = The right kidney enlarged and heterogeneous. There is irregular peripheral tissue and increased echogenicity compared to the renal parenchyma. There is associated mass effect and compression of the renal parenchyma. This may represent a subcapsular hematoma, an infected collection, or neoplasm.  Renal function improved however during course of hosp- crt down to 1.45 on 12/23.  However, was decompensating in other ways-  Respiratory wise and was developing confusion-  Intubated, now in ICU, on pressors and crt up to 3.14 today.  Had abd CT confirmed abnormal appearance of right kidney but does not seem to be obstructed.  UOP not bad but dropping- k climbing- is 6.6 with BUN of 110 so I am consulted for initiation of CRRT.  There is documented DVT with possible PE-  Currently getting an MRI to take closer look at this kidney mass to see if can undergo systemic anticoagulation  Creatinine, Ser  Date/Time Value Ref Range Status  03/21/2020 07:33 AM 3.14 (H) 0.61 - 1.24 mg/dL Final  03/21/2020 01:19 AM 2.85 (H) 0.61 - 1.24 mg/dL Final  03/20/2020 09:53 PM 2.56 (H) 0.61 - 1.24 mg/dL Final  03/20/2020 05:00 AM 1.82 (H) 0.61 - 1.24 mg/dL Final  03/19/2020 06:01 AM 1.45 (H) 0.61 - 1.24 mg/dL Final  03/18/2020 12:26 PM 1.54 (H) 0.61 - 1.24 mg/dL Final  03/17/2020 06:24 AM 1.49 (H) 0.61 - 1.24 mg/dL Final  03/16/2020 04:32 AM 1.47 (H) 0.61 - 1.24 mg/dL Final  03/15/2020 03:53 AM 1.68 (H) 0.61 - 1.24 mg/dL Final  03/14/2020 12:07 AM 2.28 (H) 0.61 - 1.24 mg/dL Final  03/13/2020 07:25 AM 3.27 (H) 0.61 - 1.24 mg/dL Final  03/17/2020 09:25 PM 3.77 (H) 0.61 - 1.24 mg/dL Final  03/13/2020  01:36 PM 4.69 (H) 0.61 - 1.24 mg/dL Final     PMHx:  History reviewed. No pertinent past medical history.  History reviewed. No pertinent surgical history.  Family Hx: History reviewed. No pertinent family history.  Social History:  reports that he has quit smoking. He has never used smokeless tobacco. No history on file for alcohol use and drug use.  Allergies: No Known Allergies  Medications: Prior to Admission medications   Medication Sig Start Date End Date Taking? Authorizing Provider  azithromycin (ZITHROMAX) 250 MG tablet Take 250-500 mg by mouth as directed. Take 500mg  on day one, then 250mg  on days 2 through 5.   Yes [provider]  benzonatate (TESSALON) 100 MG capsule Take 100 mg by mouth 3 (three) times daily.   Yes [provider]  dexamethasone (DECADRON) 2 MG tablet Take 2-4 mg by mouth See admin instructions. Take 4mg  daily for 3 days, then 2mg  daily for 2 days.   Yes [provider]  folic acid (FOLVITE) 1 MG tablet Take 2 mg by mouth daily. Take for 5 days.   Yes [provider]  hydroxyurea (HYDREA) 500 MG capsule Take 500 mg by mouth 2 (two) times daily. Take for 5 days. May take with food to minimize GI side effects.   Yes [provider]    I have reviewed the patient's current medications.  Labs:  Results for orders placed or performed during the hospital encounter  of 2020-04-05 (from the past 48 hour(s))  Glucose, capillary     Status: Abnormal   Collection Time: 03/19/20 11:02 AM  Result Value Ref Range   Glucose-Capillary 112 (H) 70 - 99 mg/dL    Comment: Glucose reference range applies only to samples taken after fasting for at least 8 hours.  Glucose, capillary     Status: Abnormal   Collection Time: 03/19/20  4:32 PM  Result Value Ref Range   Glucose-Capillary 133 (H) 70 - 99 mg/dL    Comment: Glucose reference range applies only to samples taken after fasting for at least 8 hours.  Glucose, capillary      Status: Abnormal   Collection Time: 03/19/20  7:48 PM  Result Value Ref Range   Glucose-Capillary 133 (H) 70 - 99 mg/dL    Comment: Glucose reference range applies only to samples taken after fasting for at least 8 hours.  Glucose, capillary     Status: Abnormal   Collection Time: 03/20/20 12:07 AM  Result Value Ref Range   Glucose-Capillary 148 (H) 70 - 99 mg/dL    Comment: Glucose reference range applies only to samples taken after fasting for at least 8 hours.  Glucose, capillary     Status: Abnormal   Collection Time: 03/20/20  3:50 AM  Result Value Ref Range   Glucose-Capillary 152 (H) 70 - 99 mg/dL    Comment: Glucose reference range applies only to samples taken after fasting for at least 8 hours.  Phosphorus     Status: None   Collection Time: 03/20/20  5:00 AM  Result Value Ref Range   Phosphorus 3.4 2.5 - 4.6 mg/dL    Comment: Performed at [facility], [address], [place], [place] [address]  Comprehensive metabolic panel     Status: Abnormal   Collection Time: 03/20/20  5:00 AM  Result Value Ref Range   Sodium 155 (H) 135 - 145 mmol/L   Potassium 6.0 (H) 3.5 - 5.1 mmol/L   Chloride 126 (H) 98 - 111 mmol/L   CO2 17 (L) 22 - 32 mmol/L   Glucose, Bld 147 (H) 70 - 99 mg/dL    Comment: Glucose reference range applies only to samples taken after fasting for at least 8 hours.   BUN 79 (H) 8 - 23 mg/dL   Creatinine, Ser 0.16 (H) 0.61 - 1.24 mg/dL   Calcium 9.2 8.9 - 55.3 mg/dL   Total Protein 6.3 (L) 6.5 - 8.1 g/dL   Albumin 2.3 (L) 3.5 - 5.0 g/dL   AST 748 (H) 15 - 41 U/L   ALT 311 (H) 0 - 44 U/L   Alkaline Phosphatase 210 (H) 38 - 126 U/L   Total Bilirubin 3.0 (H) 0.3 - 1.2 mg/dL   GFR, Estimated 41 (L) >60 mL/min    Comment: (NOTE) Calculated using the CKD-EPI Creatinine Equation (2021)    Anion gap 12 5 - 15    Comment: Performed at [facility], [address] [place], [place] [address]  Magnesium     Status: Abnormal   Collection Time: 03/20/20   5:00 AM  Result Value Ref Range   Magnesium 3.2 (H) 1.7 - 2.4 mg/dL    Comment: Performed at [facility], [address] [place], [place] [address]  CBC     Status: Abnormal   Collection Time: 03/20/20  5:00 AM  Result Value Ref Range   WBC 20.3 (H) 4.0 - 10.5 K/uL  RBC 4.85 4.22 - 5.81 MIL/uL   Hemoglobin 14.8 13.0 - 17.0 g/dL   HCT 45.7 39.0 - 52.0 %   MCV 94.2 80.0 - 100.0 fL   MCH 30.5 26.0 - 34.0 pg   MCHC 32.4 30.0 - 36.0 g/dL   RDW 16.0 (H) 11.5 - 15.5 %   Platelets 221 150 - 400 K/uL   nRBC 0.0 0.0 - 0.2 %    Comment: Performed at [facility] [address], [place], [place] [address]  Glucose, capillary     Status: Abnormal   Collection Time: 03/20/20  8:33 AM  Result Value Ref Range   Glucose-Capillary 147 (H) 70 - 99 mg/dL    Comment: Glucose reference range applies only to samples taken after fasting for at least 8 hours.  Glucose, capillary     Status: Abnormal   Collection Time: 03/20/20  1:10 PM  Result Value Ref Range   Glucose-Capillary 141 (H) 70 - 99 mg/dL    Comment: Glucose reference range applies only to samples taken after fasting for at least 8 hours.  I-STAT 7, (LYTES, BLD GAS, ICA, H+H)     Status: Abnormal   Collection Time: 03/20/20  2:28 PM  Result Value Ref Range   pH, Arterial 7.360 7.350 - 7.450   pCO2 arterial 29.7 (L) 32.0 - 48.0 mmHg   pO2, Arterial 63 (L) 83.0 - 108.0 mmHg   Bicarbonate 16.8 (L) 20.0 - 28.0 mmol/L   TCO2 18 (L) 22 - 32 mmol/L   O2 Saturation 91.0 %   Acid-base deficit 7.0 (H) 0.0 - 2.0 mmol/L   Sodium 156 (H) 135 - 145 mmol/L   Potassium 5.7 (H) 3.5 - 5.1 mmol/L   Calcium, Ion 1.31 1.15 - 1.40 mmol/L   HCT 37.0 (L) 39.0 - 52.0 %   Hemoglobin 12.6 (L) 13.0 - 17.0 g/dL   Collection site Radial    Drawn by RT    Sample type ARTERIAL   Glucose, capillary     Status: Abnormal   Collection Time: 03/20/20  4:08 PM  Result Value Ref Range   Glucose-Capillary 152 (H) 70 - 99 mg/dL    Comment: Glucose  reference range applies only to samples taken after fasting for at least 8 hours.  I-STAT 7, (LYTES, BLD GAS, ICA, H+H)     Status: Abnormal   Collection Time: 03/20/20  6:16 PM  Result Value Ref Range   pH, Arterial 7.110 (LL) 7.350 - 7.450   pCO2 arterial 73.7 (HH) 32.0 - 48.0 mmHg   pO2, Arterial 111 (H) 83.0 - 108.0 mmHg   Bicarbonate 23.4 20.0 - 28.0 mmol/L   TCO2 26 22 - 32 mmol/L   O2 Saturation 96.0 %   Acid-base deficit 7.0 (H) 0.0 - 2.0 mmol/L   Sodium 156 (H) 135 - 145 mmol/L   Potassium 6.4 (HH) 3.5 - 5.1 mmol/L   Calcium, Ion 1.30 1.15 - 1.40 mmol/L   HCT 40.0 39.0 - 52.0 %   Hemoglobin 13.6 13.0 - 17.0 g/dL   Collection site Radial    Drawn by RT    Sample type ARTERIAL    Comment NOTIFIED PHYSICIAN   Glucose, capillary     Status: Abnormal   Collection Time: 03/20/20  7:43 PM  Result Value Ref Range   Glucose-Capillary 132 (H) 70 - 99 mg/dL    Comment: Glucose reference range applies only to samples taken after fasting for at least 8 hours.  I-STAT 7, (LYTES,  BLD GAS, ICA, H+H)     Status: Abnormal   Collection Time: 03/20/20  9:01 PM  Result Value Ref Range   pH, Arterial 7.191 (LL) 7.350 - 7.450   pCO2 arterial 57.9 (H) 32.0 - 48.0 mmHg   pO2, Arterial 173 (H) 83.0 - 108.0 mmHg   Bicarbonate 22.2 20.0 - 28.0 mmol/L   TCO2 24 22 - 32 mmol/L   O2 Saturation 99.0 %   Acid-base deficit 6.0 (H) 0.0 - 2.0 mmol/L   Sodium 154 (H) 135 - 145 mmol/L   Potassium 7.2 (HH) 3.5 - 5.1 mmol/L   Calcium, Ion 1.28 1.15 - 1.40 mmol/L   HCT 35.0 (L) 39.0 - 52.0 %   Hemoglobin 11.9 (L) 13.0 - 17.0 g/dL   Collection site art line    Drawn by RT    Sample type ARTERIAL    Comment NOTIFIED PHYSICIAN   Basic metabolic panel     Status: Abnormal   Collection Time: 03/20/20  9:53 PM  Result Value Ref Range   Sodium 154 (H) 135 - 145 mmol/L   Potassium >7.5 (HH) 3.5 - 5.1 mmol/L    Comment: NO VISIBLE HEMOLYSIS CRITICAL RESULT CALLED TO, READ BACK BY AND VERIFIED WITH: ERIC  STEELY RN [phone] M GARRETT    Chloride 126 (H) 98 - 111 mmol/L   CO2 21 (L) 22 - 32 mmol/L   Glucose, Bld 153 (H) 70 - 99 mg/dL    Comment: Glucose reference range applies only to samples taken after fasting for at least 8 hours.   BUN 96 (H) 8 - 23 mg/dL   Creatinine, Ser 2.56 (H) 0.61 - 1.24 mg/dL   Calcium 8.6 (L) 8.9 - 10.3 mg/dL   GFR, Estimated 27 (L) >60 mL/min    Comment: (NOTE) Calculated using the CKD-EPI Creatinine Equation (2021)    Anion gap 7 5 - 15    Comment: Performed at [facility] [address], [place], [place] [address]  Lactic acid, plasma     Status: None   Collection Time: 03/20/20  9:53 PM  Result Value Ref Range   Lactic Acid, Venous 1.0 0.5 - 1.9 mmol/L    Comment: Performed at [facility] [address] [place], [place] [address]  CBC     Status: Abnormal   Collection Time: 03/20/20 11:07 PM  Result Value Ref Range   WBC 15.2 (H) 4.0 - 10.5 K/uL   RBC 4.06 (L) 4.22 - 5.81 MIL/uL   Hemoglobin 12.5 (L) 13.0 - 17.0 g/dL   HCT 41.1 39.0 - 52.0 %   MCV 101.2 (H) 80.0 - 100.0 fL    Comment: REPEATED TO VERIFY DELTA CHECK NOTED REPEAT WITH NEW SAMPLE    MCH 30.8 26.0 - 34.0 pg   MCHC 30.4 30.0 - 36.0 g/dL   RDW 16.5 (H) 11.5 - 15.5 %   Platelets 148 (L) 150 - 400 K/uL   nRBC 0.0 0.0 - 0.2 %    Comment: Performed at [facility], [place] [address], [place], [place] [address]  Glucose, capillary     Status: Abnormal   Collection Time: 03/20/20 11:44 PM  Result Value Ref Range   Glucose-Capillary 216 (H) 70 - 99 mg/dL    Comment: Glucose reference range applies only to samples taken after fasting for at least 8 hours.  Comprehensive metabolic panel     Status: Abnormal   Collection Time: 03/21/20  1:19 AM  Result Value  Ref Range   Sodium 153 (H) 135 - 145 mmol/L   Potassium 7.1 (HH) 3.5 - 5.1 mmol/L    Comment: NO VISIBLE HEMOLYSIS CRITICAL RESULT CALLED TO, READ BACK BY AND VERIFIED WITH: Allegra Grana RN [id number] M  GARRETT    Chloride 124 (H) 98 - 111 mmol/L   CO2 21 (L) 22 - 32 mmol/L   Glucose, Bld 186 (H) 70 - 99 mg/dL    Comment: Glucose reference range applies only to samples taken after fasting for at least 8 hours.   BUN 102 (H) 8 - 23 mg/dL   Creatinine, Ser 2.85 (H) 0.61 - 1.24 mg/dL   Calcium 9.2 8.9 - 10.3 mg/dL   Total Protein 5.4 (L) 6.5 - 8.1 g/dL   Albumin 1.9 (L) 3.5 - 5.0 g/dL   AST 93 (H) 15 - 41 U/L   ALT 183 (H) 0 - 44 U/L   Alkaline Phosphatase 156 (H) 38 - 126 U/L   Total Bilirubin 1.4 (H) 0.3 - 1.2 mg/dL   GFR, Estimated 24 (L) >60 mL/min    Comment: (NOTE) Calculated using the CKD-EPI Creatinine Equation (2021)    Anion gap 8 5 - 15    Comment: Performed at [facility], [place] [address] [place], [place] [address]  Magnesium     Status: Abnormal   Collection Time: 03/21/20  1:19 AM  Result Value Ref Range   Magnesium 3.5 (H) 1.7 - 2.4 mg/dL    Comment: Performed at [facility] [address] [place], [place] [address]  Phosphorus     Status: Abnormal   Collection Time: 03/21/20  1:19 AM  Result Value Ref Range   Phosphorus 8.6 (H) 2.5 - 4.6 mg/dL    Comment: Performed at [facility] [address], [place], [place] [address]  I-STAT 7, (LYTES, BLD GAS, ICA, H+H)     Status: Abnormal   Collection Time: 03/21/20  1:39 AM  Result Value Ref Range   pH, Arterial 7.195 (LL) 7.350 - 7.450   pCO2 arterial 60.4 (H) 32.0 - 48.0 mmHg   pO2, Arterial 198 (H) 83.0 - 108.0 mmHg   Bicarbonate 23.2 20.0 - 28.0 mmol/L   TCO2 25 22 - 32 mmol/L   O2 Saturation 99.0 %   Acid-base deficit 5.0 (H) 0.0 - 2.0 mmol/L   Sodium 153 (H) 135 - 145 mmol/L   Potassium 7.1 (HH) 3.5 - 5.1 mmol/L   Calcium, Ion 1.37 1.15 - 1.40 mmol/L   HCT 32.0 (L) 39.0 - 52.0 %   Hemoglobin 10.9 (L) 13.0 - 17.0 g/dL   Patient temperature 99.1 F    Collection site art line    Drawn by RT    Sample type ARTERIAL    Comment NOTIFIED PHYSICIAN   Potassium     Status: Abnormal    Collection Time: 03/21/20  2:56 AM  Result Value Ref Range   Potassium 7.4 (HH) 3.5 - 5.1 mmol/L    Comment: NO VISIBLE HEMOLYSIS CRITICAL RESULT CALLED TO, READ BACK BY AND VERIFIED WITHJacinto Reap EPPS RN [id number] [phone] Sander Radon Performed at [facility], [place] [address] [place], [place] [address]   Glucose, capillary     Status: Abnormal   Collection Time: 03/21/20  3:01 AM  Result Value Ref Range   Glucose-Capillary 157 (H) 70 - 99 mg/dL    Comment: Glucose reference range applies only to samples taken after fasting for at least 8  hours.  I-STAT 7, (LYTES, BLD GAS, ICA, H+H)     Status: Abnormal   Collection Time: 03/21/20  4:22 AM  Result Value Ref Range   pH, Arterial 7.220 (L) 7.350 - 7.450   pCO2 arterial 56.8 (H) 32.0 - 48.0 mmHg   pO2, Arterial 151 (H) 83.0 - 108.0 mmHg   Bicarbonate 23.3 20.0 - 28.0 mmol/L   TCO2 25 22 - 32 mmol/L   O2 Saturation 99.0 %   Acid-base deficit 5.0 (H) 0.0 - 2.0 mmol/L   Sodium 155 (H) 135 - 145 mmol/L   Potassium 6.4 (HH) 3.5 - 5.1 mmol/L   Calcium, Ion 1.55 (HH) 1.15 - 1.40 mmol/L   HCT 32.0 (L) 39.0 - 52.0 %   Hemoglobin 10.9 (L) 13.0 - 17.0 g/dL   Collection site art line    Drawn by RT    Sample type ARTERIAL    Comment NOTIFIED PHYSICIAN   Lactic acid, plasma     Status: None   Collection Time: 03/21/20  5:27 AM  Result Value Ref Range   Lactic Acid, Venous 1.4 0.5 - 1.9 mmol/L    Comment: Performed at [facility], [place] [address] [place], [place] [address]  Triglycerides     Status: Abnormal   Collection Time: 03/21/20  5:27 AM  Result Value Ref Range   Triglycerides 228 (H) <150 mg/dL    Comment: Performed at [facility] [address] [place], [place] [address]  Potassium     Status: Abnormal   Collection Time: 03/21/20  5:27 AM  Result Value Ref Range   Potassium 6.5 (HH) 3.5 - 5.1 mmol/L    Comment: NO VISIBLE HEMOLYSIS CRITICAL RESULT CALLED TO, READ BACK BY AND VERIFIED WITH: D.TUCHMAN,RN [id number] 03/21/20  CLARK,S Performed at [facility] [address] [place], [place] [address]   Glucose, capillary     Status: Abnormal   Collection Time: 03/21/20  7:31 AM  Result Value Ref Range   Glucose-Capillary 180 (H) 70 - 99 mg/dL    Comment: Glucose reference range applies only to samples taken after fasting for at least 8 hours.  CBC with Differential/Platelet     Status: Abnormal   Collection Time: 03/21/20  7:33 AM  Result Value Ref Range   WBC 13.9 (H) 4.0 - 10.5 K/uL   RBC 3.88 (L) 4.22 - 5.81 MIL/uL   Hemoglobin 11.5 (L) 13.0 - 17.0 g/dL   HCT 39.4 39.0 - 52.0 %   MCV 101.5 (H) 80.0 - 100.0 fL   MCH 29.6 26.0 - 34.0 pg   MCHC 29.2 (L) 30.0 - 36.0 g/dL   RDW 16.1 (H) 11.5 - 15.5 %   Platelets 138 (L) 150 - 400 K/uL   nRBC 0.0 0.0 - 0.2 %   Neutrophils Relative % 92 %   Neutro Abs 12.7 (H) 1.7 - 7.7 K/uL   Lymphocytes Relative 3 %   Lymphs Abs 0.4 (L) 0.7 - 4.0 K/uL   Monocytes Relative 5 %   Monocytes Absolute 0.7 0.1 - 1.0 K/uL   Eosinophils Relative 0 %   Eosinophils Absolute 0.0 0.0 - 0.5 K/uL   Basophils Relative 0 %   Basophils Absolute 0.0 0.0 - 0.1 K/uL   Immature Granulocytes 0 %   Abs Immature Granulocytes 0.06 0.00 - 0.07 K/uL    Comment: Performed at [facility], [address], [place],  [address]  Basic metabolic panel  Status: Abnormal   Collection Time: 03/21/20  7:33 AM  Result Value Ref Range   Sodium 155 (H) 135 - 145 mmol/L   Potassium 6.6 (HH) 3.5 - 5.1 mmol/L    Comment: CRITICAL RESULT CALLED TO, READ BACK BY AND VERIFIED WITH: M.WOOD,RN [id number] 03/21/20 CLARK,S NO VISIBLE HEMOLYSIS    Chloride 119 (H) 98 - 111 mmol/L   CO2 21 (L) 22 - 32 mmol/L   Glucose, Bld 224 (H) 70 - 99 mg/dL    Comment: Glucose reference range applies only to samples taken after fasting for at least 8 hours.   BUN 110 (H) 8 - 23 mg/dL   Creatinine, Ser 3.14 (H) 0.61 - 1.24 mg/dL   Calcium 9.4 8.9 - 10.3 mg/dL   GFR, Estimated 21 (L) >60 mL/min     Comment: (NOTE) Calculated using the CKD-EPI Creatinine Equation (2021)    Anion gap 15 5 - 15    Comment: Performed at [facility] [address] [place], [place] [address]  Urinalysis, Routine w reflex microscopic     Status: Abnormal   Collection Time: 03/21/20  8:47 AM  Result Value Ref Range   Color, Urine AMBER (A) YELLOW    Comment: BIOCHEMICALS MAY BE AFFECTED BY COLOR   APPearance CLOUDY (A) CLEAR   Specific Gravity, Urine 1.015 1.005 - 1.030   pH 5.0 5.0 - 8.0   Glucose, UA NEGATIVE NEGATIVE mg/dL   Hgb urine dipstick SMALL (A) NEGATIVE   Bilirubin Urine NEGATIVE NEGATIVE   Ketones, ur NEGATIVE NEGATIVE mg/dL   Protein, ur 30 (A) NEGATIVE mg/dL   Nitrite NEGATIVE NEGATIVE   Leukocytes,Ua NEGATIVE NEGATIVE   RBC / HPF 0-5 0 - 5 RBC/hpf   WBC, UA 6-10 0 - 5 WBC/hpf   Bacteria, UA NONE SEEN NONE SEEN   Squamous Epithelial / LPF 0-5 0 - 5   Mucus PRESENT    Amorphous Crystal PRESENT     Comment: Performed at [facility], [place] [address], [place], [place] [address]  Lactic acid, plasma     Status: Abnormal   Collection Time: 03/21/20  8:49 AM  Result Value Ref Range   Lactic Acid, Venous 2.5 (HH) 0.5 - 1.9 mmol/L    Comment: CRITICAL RESULT CALLED TO, READ BACK BY AND VERIFIED WITH: D.TUCHMAN,RN [id number] 03/21/20 CLARK,S Performed at [facility], [place] [address], [place], [place] [address]   Procalcitonin - Baseline     Status: None   Collection Time: 03/21/20  8:49 AM  Result Value Ref Range   Procalcitonin 1.38 ng/mL    Comment:        Interpretation: PCT > 0.5 ng/mL and <= 2 ng/mL: Systemic infection (sepsis) is possible, but other conditions are known to elevate PCT as well. (NOTE)       Sepsis PCT Algorithm           Lower Respiratory Tract                                      Infection PCT Algorithm    ----------------------------     ----------------------------         PCT < 0.25 ng/mL                PCT < 0.10 ng/mL          Strongly  encourage  Strongly discourage   discontinuation of antibiotics    initiation of antibiotics    ----------------------------     -----------------------------       PCT 0.25 - 0.50 ng/mL            PCT 0.10 - 0.25 ng/mL               OR       >80% decrease in PCT            Discourage initiation of                                            antibiotics      Encourage discontinuation           of antibiotics    ----------------------------     -----------------------------         PCT >= 0.50 ng/mL              PCT 0.26 - 0.50 ng/mL                AND       <80% decrease in PCT             Encourage initiation of                                             antibiotics       Encourage continuation           of antibiotics    ----------------------------     -----------------------------        PCT >= 0.50 ng/mL                  PCT > 0.50 ng/mL               AND         increase in PCT                  Strongly encourage                                      initiation of antibiotics    Strongly encourage escalation           of antibiotics                                     -----------------------------                                           PCT <= 0.25 ng/mL                                                 OR                                        >  80% decrease in PCT                                      Discontinue / Do not initiate                                             antibiotics  Performed at [facility], [place] [address] [place], [place] [address]      ROS:  Review of systems not obtained due to patient factors.  Physical Exam: Vitals:   03/21/20 0815 03/21/20 0900  BP:    Pulse:  79  Resp:  (!) 29  Temp:    SpO2: 93% 92%     PE not done due to COVID precautions and is currently down in MRI  Assessment/Plan:  64 year old male smoker with symptomatic covid - decompensation over last 48 hours, now with intubation and pressors-  Had AKI-    Improved but now recurred with hyperkalemia 1.Renal- AKI upon admit in the setting of COVID but also possibly perinephric bleed-  Did improve- crt nadir 1.4 until again started to decompensate medically and developed resp failure/ hypotension and had recurrence of AKI-   Now with hyperkalemia-  Will go ahead and initiate supportive CRRT today - appreciate CCM for cath placement 2. Hypertension/volume  - in review of chart not seeming to be volume overloaded-  Getting d5w at 75 for hypernatremia and also sodium bicarb iv infusion at 125.  Will run even with CRRT- OK to bolus outside of CRRT - no heparin but will use oxaris filter  3. Hypernatremia-  Giving d5w at 75 per hour 4. Hyperkalemia-  Medical management so far, will be starting CRRT-  2 K baths 5. Anemia  - not a major issue as of yet 6. VDRF-  Per CCM-  Looking to anticoagulate if can due to DVT and presumed PE 7. ID-  Per CCM-  Started cefepime/flagyl    Louis Meckel 03/21/2020, 10:34 AM

## 2020-03-21 NOTE — Progress Notes (Addendum)
Evening rounds  - On CRRT - On Vent - MRI with renal cell CA with extensive spinal/pelvis metasitis  D/w Urology on call   - says bx will be risky and generall widespread lesion is not operable - oncology prognosis - > recommends  Med oncology consult - ok to start IV heparin   Updated daughter  Addition 30 min ccm time  SIGNATURE    Dr. Brand Males, M.D., F.C.C.P,  Pulmonary and Critical Care Medicine Staff Physician, [place] Director - Interstitial Lung Disease  Program  Pulmonary [place] [facility] [place], [place], [address]  Pager: [phone], If no answer  OR between  19:00-7:00h: page [phone] Telephone (clinical office): [phone] Telephone (research): [phone]  4:49 PM 03/21/2020     MR ABDOMEN W WO CONTRAST  Result Date: 03/21/2020 CLINICAL DATA:  Renal mass, renal insufficiency, concern for hematoma EXAM: MRI ABDOMEN WITHOUT AND WITH CONTRAST TECHNIQUE: Multiplanar multisequence MR imaging of the abdomen was performed both before and after the administration of intravenous contrast. CONTRAST:  88mL GADAVIST GADOBUTROL 1 MMOL/ML IV SOLN COMPARISON:  CT abdomen pelvis, 03/21/2020, 03/25/2020 FINDINGS: Lower chest: Bibasilar heterogeneous airspace disease. Hepatobiliary: There are multiple, ill-defined transient peripheral subcapsular hyperenhancing foci of the liver parenchyma (series 18, image 80 123). No definite mass or other parenchymal abnormality identified. Pancreas: No mass, inflammatory changes, or other parenchymal abnormality identified. Spleen:  Within normal limits in size and appearance. Adrenals/Urinary Tract: There is a large mass with heterogeneous intrinsic signal and extensive, heterogeneous contrast enhancement about the periphery of the right kidney, which although unusual and somewhat perinephric in appearance clearly violate the normal contour of the renal cortex and  demonstrates a much greater degree of contrast enhancement thancould ascribed to a perinephric hematoma (series 22, image 38, series 24, image 33). Suspect direct involvement of the adjacent right adrenal gland and inferior vena cava (series 22, image 30). Overall dimensions of the mass, although difficult to distinguish from the normal kidney, are approximately 13.5 x 11.4 x 6.9 cm. The left kidney and adrenal gland are normal no evidence of hydronephrosis. Stomach/Bowel: Inflammatory findings about the sigmoid colon are not well imaged, seen only on select coronal images (series 24, image 51). Vascular/Lymphatic: No pathologically enlarged lymph nodes identified. Aortic atherosclerosis. No abdominal aortic aneurysm demonstrated. Other:  None. Musculoskeletal: There are numerous abnormally contrast enhancing lesions noted throughout the included spine and pelvis (e.g. Series 24, image 31, 34). IMPRESSION: 1. Large mass with heterogeneous intrinsic signal and extensive, heterogeneous contrast enhancement about the periphery of the right kidney, which although unusual and somewhat perinephric in appearance clearly violates the normal contour of the renal cortex and demonstrates a much greater degree of contrast enhancement density compatible with a bland perinephric hematoma. Findings are consistent with a large renal cell carcinoma, although difficult to accurately measure approximately 13.5 x 11.4 x 6.9 cm. Suspect direct involvement of the adjacent right adrenal gland and inferior vena cava. 2. Numerous abnormally contrast enhancing lesions throughout the included spine and pelvis, concerning for widespread osseous metastatic disease. 3. There are multiple, ill-defined transient peripheral subcapsular hyperenhancing foci of the liver parenchyma. There is no definite mass or other parenchymal abnormality identified. These most likely reflect transient hepatic intensity differences. Hepatic metastatic disease is  unusual in renal cell carcinoma and not favored. 4. No lymphadenopathy in the abdomen. 5. Inflammatory findings about the sigmoid colon are not well  imaged, seen only on select coronal images and better assessed by same-day CT. Aortic Atherosclerosis (ICD10-I70.0). Electronically Signed   By: Eddie Candle M.D.   On: 03/21/2020 11:58   DG Chest Port 1 View  Result Date: 03/21/2020 CLINICAL DATA:  Central line placement EXAM: PORTABLE CHEST 1 VIEW COMPARISON:  Portable exam 1305 hours compared to 0518 hours FINDINGS: Tip of endotracheal tube projects 1.2 cm above carina, recommend withdrawal 1-2 cm. RIGHT jugular central venous catheter with tip projecting over SVC. Normal heart size, mediastinal contours, and pulmonary vascularity. Persistent bibasilar infiltrates persist. No pleural effusion or pneumothorax. Osseous structures unremarkable. IMPRESSION: Persistent bibasilar infiltrates. No pneumothorax following RIGHT jugular line placement. Recommend withdrawal of endotracheal tube 1-2 cm. Findings called to patient's nurse RN in MICU on 03/21/2020 at 1313 hours. Electronically Signed   By: Lavonia Dana M.D.   On: 03/21/2020 13:17   DG CHEST PORT 1 VIEW  Result Date: 03/21/2020 CLINICAL DATA:  Endotracheal tube EXAM: PORTABLE CHEST 1 VIEW COMPARISON:  Yesterday FINDINGS: Endotracheal tube with tip halfway between the clavicular heads and carina. Feeding tube that at least reaches the stomach. Bilateral airspace disease. Hyperinflation and emphysema. No visible effusion or pneumothorax. Normal heart size. IMPRESSION: 1. Stable hardware positioning. 2. Stable pneumonia superimposed on emphysema. Electronically Signed   By: Monte Fantasia M.D.   On: 03/21/2020 07:12   DG CHEST PORT 1 VIEW  Result Date: 03/20/2020 CLINICAL DATA:  Intubation EXAM: PORTABLE CHEST 1 VIEW COMPARISON:  Chest x-ray 12242 5:26 a.m. FINDINGS: Interval placement of endotracheal tube with tip terminating 7 cm above the carina. An  enteric tube is noted to course below the diaphragm with tip collimated off view. The guidewire is noted still present within the enteric tube. The heart size and mediastinal contours are within normal limits. Persistent bilateral patchy airspace opacities. No pulmonary edema. No pleural effusion. No pneumothorax. No acute osseous abnormality. IMPRESSION: 1. Interval placement of an endotracheal tube with tip 7 cm above the carina. Consider advancing by 1-2 cm. 2. Similar appearing multifocal pneumonia. Electronically Signed   By: Iven Finn M.D.   On: 03/20/2020 17:37   DG CHEST PORT 1 VIEW  Result Date: 03/20/2020 CLINICAL DATA:  Acute on chronic respiratory failure. EXAM: PORTABLE CHEST 1 VIEW COMPARISON:  03/16/2020 FINDINGS: 0526 hours. Cardiopericardial silhouette is at upper limits of normal for size. Interstitial markings are diffusely coarsened with chronic features. The bibasilar interstitial and patchy airspace disease is similar to prior. No pleural effusion. Old right clavicle fracture. Telemetry leads overlie the chest. A feeding tube passes into the stomach although the distal tip position is not included on the film. IMPRESSION: Stable exam. Bibasilar interstitial and patchy airspace disease. Electronically Signed   By: Misty Stanley M.D.   On: 03/20/2020 09:06   [place] EKG SITE RITE  Result Date: 03/18/2020 If Site Rite image not attached, placement could not be confirmed due to current cardiac rhythm.

## 2020-03-22 DIAGNOSIS — J9620 Acute and chronic respiratory failure, unspecified whether with hypoxia or hypercapnia: Secondary | ICD-10-CM | POA: Diagnosis not present

## 2020-03-22 DIAGNOSIS — C641 Malignant neoplasm of right kidney, except renal pelvis: Secondary | ICD-10-CM

## 2020-03-22 DIAGNOSIS — U071 COVID-19: Secondary | ICD-10-CM | POA: Diagnosis not present

## 2020-03-22 DIAGNOSIS — I82402 Acute embolism and thrombosis of unspecified deep veins of left lower extremity: Secondary | ICD-10-CM

## 2020-03-22 DIAGNOSIS — J80 Acute respiratory distress syndrome: Secondary | ICD-10-CM | POA: Diagnosis not present

## 2020-03-22 DIAGNOSIS — I824Z9 Acute embolism and thrombosis of unspecified deep veins of unspecified distal lower extremity: Secondary | ICD-10-CM | POA: Diagnosis not present

## 2020-03-22 LAB — BLOOD CULTURE ID PANEL (REFLEXED) - BCID2

## 2020-03-22 LAB — POCT I-STAT 7, (LYTES, BLD GAS, ICA,H+H)
Acid-Base Excess: 5 mmol/L — ABNORMAL HIGH (ref 0.0–2.0)
Bicarbonate: 31.5 mmol/L — ABNORMAL HIGH (ref 20.0–28.0)
Calcium, Ion: 1.09 mmol/L — ABNORMAL LOW (ref 1.15–1.40)
HCT: 33 % — ABNORMAL LOW (ref 39.0–52.0)
Hemoglobin: 11.2 g/dL — ABNORMAL LOW (ref 13.0–17.0)
O2 Saturation: 96 %
Patient temperature: 97.7
Potassium: 4.3 mmol/L (ref 3.5–5.1)
Sodium: 143 mmol/L (ref 135–145)
TCO2: 33 mmol/L — ABNORMAL HIGH (ref 22–32)
pCO2 arterial: 54.3 mmHg — ABNORMAL HIGH (ref 32.0–48.0)
pH, Arterial: 7.369 (ref 7.350–7.450)
pO2, Arterial: 85 mmHg (ref 83.0–108.0)

## 2020-03-22 LAB — GLUCOSE, CAPILLARY
Glucose-Capillary: 137 mg/dL — ABNORMAL HIGH (ref 70–99)
Glucose-Capillary: 115 mg/dL — ABNORMAL HIGH (ref 70–99)
Glucose-Capillary: 94 mg/dL (ref 70–99)
Glucose-Capillary: 78 mg/dL (ref 70–99)

## 2020-03-22 LAB — CBC
HCT: 36.2 % — ABNORMAL LOW (ref 39.0–52.0)
Hemoglobin: 11 g/dL — ABNORMAL LOW (ref 13.0–17.0)
MCH: 29.6 pg (ref 26.0–34.0)
MCHC: 30.4 g/dL (ref 30.0–36.0)
MCV: 97.6 fL (ref 80.0–100.0)
Platelets: 125 10*3/uL — ABNORMAL LOW (ref 150–400)
RBC: 3.71 MIL/uL — ABNORMAL LOW (ref 4.22–5.81)
RDW: 15.6 % — ABNORMAL HIGH (ref 11.5–15.5)
WBC: 14.1 10*3/uL — ABNORMAL HIGH (ref 4.0–10.5)
nRBC: 0 % (ref 0.0–0.2)

## 2020-03-22 LAB — COMPREHENSIVE METABOLIC PANEL
ALT: 124 U/L — ABNORMAL HIGH (ref 0–44)
AST: 44 U/L — ABNORMAL HIGH (ref 15–41)
Albumin: 1.9 g/dL — ABNORMAL LOW (ref 3.5–5.0)
Alkaline Phosphatase: 136 U/L — ABNORMAL HIGH (ref 38–126)
Anion gap: 12 (ref 5–15)
BUN: 56 mg/dL — ABNORMAL HIGH (ref 8–23)
CO2: 27 mmol/L (ref 22–32)
Calcium: 7.8 mg/dL — ABNORMAL LOW (ref 8.9–10.3)
Chloride: 103 mmol/L (ref 98–111)
Creatinine, Ser: 2.21 mg/dL — ABNORMAL HIGH (ref 0.61–1.24)
GFR, Estimated: 32 mL/min — ABNORMAL LOW (ref >60)
Glucose, Bld: 137 mg/dL — ABNORMAL HIGH (ref 70–99)
Potassium: 4.3 mmol/L (ref 3.5–5.1)
Sodium: 142 mmol/L (ref 135–145)
Total Bilirubin: 0.9 mg/dL (ref 0.3–1.2)
Total Protein: 5.6 g/dL — ABNORMAL LOW (ref 6.5–8.1)

## 2020-03-22 LAB — PHOSPHORUS: Phosphorus: 3.9 mg/dL (ref 2.5–4.6)

## 2020-03-22 LAB — LACTIC ACID, PLASMA: Lactic Acid, Venous: 1.9 mmol/L (ref 0.5–1.9)

## 2020-03-22 LAB — MAGNESIUM: Magnesium: 2.5 mg/dL — ABNORMAL HIGH (ref 1.7–2.4)

## 2020-03-22 LAB — HEPARIN LEVEL (UNFRACTIONATED)
Heparin Unfractionated: 0.13 IU/mL — ABNORMAL LOW (ref 0.30–0.70)
Heparin Unfractionated: 0.33 IU/mL (ref 0.30–0.70)

## 2020-03-22 LAB — PROCALCITONIN: Procalcitonin: 0.85 ng/mL

## 2020-03-22 MED ORDER — DIPHENHYDRAMINE HCL 50 MG/ML IJ SOLN: 25.0000 mg | INTRAMUSCULAR | Status: DC | PRN

## 2020-03-22 MED ORDER — POLYETHYLENE GLYCOL 3350 17 G PO PACK: 17.0000 g | PACK | Freq: Every day | ORAL | Status: DC

## 2020-03-22 MED ORDER — MIDAZOLAM BOLUS VIA INFUSION
1.0000 mg | INTRAVENOUS | Status: DC | PRN
  Administered 2020-03-22: 20:00:00 2 mg via INTRAVENOUS
  Administered 2020-03-22: 17:00:00 1 mg via INTRAVENOUS
  Filled 2020-03-22: qty 2

## 2020-03-22 MED ORDER — ACETAMINOPHEN 325 MG PO TABS: 650.0000 mg | ORAL_TABLET | Freq: Four times a day (QID) | ORAL | Status: DC | PRN

## 2020-03-22 MED ORDER — POLYVINYL ALCOHOL 1.4 % OP SOLN
1.0000 [drp] | Freq: Four times a day (QID) | OPHTHALMIC | Status: DC | PRN
  Filled 2020-03-22: qty 15

## 2020-03-22 MED ORDER — FENTANYL CITRATE (PF) 100 MCG/2ML IJ SOLN
25.0000 ug | INTRAMUSCULAR | Status: DC | PRN
  Administered 2020-03-22 (×2): 100 ug via INTRAVENOUS

## 2020-03-22 MED ORDER — MIDAZOLAM HCL 2 MG/2ML IJ SOLN: 1.0000 mg | INTRAMUSCULAR | Status: DC | PRN

## 2020-03-22 MED ORDER — MIDAZOLAM 50MG/50ML (1MG/ML) PREMIX INFUSION
0.0000 mg/h | INTRAVENOUS | Status: DC
  Administered 2020-03-22: 15:00:00 2 mg/h via INTRAVENOUS
  Filled 2020-03-22: qty 50

## 2020-03-22 MED ORDER — GLYCOPYRROLATE 0.2 MG/ML IJ SOLN: 0.2000 mg | INTRAMUSCULAR | Status: DC | PRN

## 2020-03-22 MED ORDER — SODIUM CHLORIDE 0.9% FLUSH: 10.0000 mL | Freq: Two times a day (BID) | INTRAVENOUS | Status: DC

## 2020-03-22 MED ORDER — DOCUSATE SODIUM 50 MG/5ML PO LIQD: 100.0000 mg | Freq: Two times a day (BID) | ORAL | Status: DC

## 2020-03-22 MED ORDER — GLYCOPYRROLATE 1 MG PO TABS: 1.0000 mg | ORAL_TABLET | ORAL | Status: DC | PRN

## 2020-03-22 MED ORDER — DEXTROSE 5 % IV SOLN: INTRAVENOUS | Status: DC

## 2020-03-22 MED ORDER — PANTOPRAZOLE SODIUM 40 MG IV SOLR
40.0000 mg | INTRAVENOUS | Status: DC
  Administered 2020-03-22: 13:00:00 40 mg via INTRAVENOUS
  Filled 2020-03-22: qty 40

## 2020-03-22 MED ORDER — HEPARIN BOLUS VIA INFUSION
2500.0000 [IU] | Freq: Once | INTRAVENOUS | Status: DC
  Administered 2020-03-22: 04:00:00 2500 [IU] via INTRAVENOUS
  Filled 2020-03-22: qty 2500

## 2020-03-22 MED ORDER — MIDAZOLAM 50MG/50ML (1MG/ML) PREMIX INFUSION: 0.0000 mg/h | INTRAVENOUS | Status: DC

## 2020-03-22 MED ORDER — SODIUM CHLORIDE 0.9% FLUSH
10.0000 mL | INTRAVENOUS | Status: DC | PRN
Start: 2020-03-22 — End: 2020-03-22

## 2020-03-22 MED ORDER — ACETAMINOPHEN 650 MG RE SUPP: 650.0000 mg | Freq: Four times a day (QID) | RECTAL | Status: DC | PRN

## 2020-03-22 MED ORDER — MIDAZOLAM BOLUS VIA INFUSION (WITHDRAWAL LIFE SUSTAINING TX)
2.0000 mg | INTRAVENOUS | Status: DC | PRN
  Filled 2020-03-22: qty 2

## 2020-03-24 LAB — CULTURE, BLOOD (ROUTINE X 2): Special Requests: ADEQUATE

## 2020-03-26 LAB — CULTURE, BLOOD (ROUTINE X 2): Culture: NO GROWTH

## 2020-03-28 NOTE — Procedures (Signed)
Extubation Procedure Note  Patient Details:   Name: Philip Stafford DOB: 1955-06-30 MRN: [id number]   Airway Documentation:    Vent end date: March 31, 2020 Vent end time: 2015   Evaluation  O2 sats: currently acceptable Complications: No apparent complications Patient did tolerate procedure well. Bilateral Breath Sounds: Clear,Diminished   No   RT extubated patient to 2L [place], comfort care,  per extubation order.  Patsy Baltimore Karoline Fleer Mar 31, 2020, 8:25 PM

## 2020-03-28 NOTE — Discharge Summary (Signed)
DISCHARGE SUMMARY    Date of admit: 03/21/2020  1:11 PM Date of discharge: 04/04/2020 10:05 PM Length of Stay: 9 days  PCP is Philip Stafford, No Pcp Per  CAUSE(S) OF DEATH COvid-19   PROBLEM LIST Active Problems:   Pneumonia due to COVID-19 virus   Acute and chronic respiratory failure (acute-on-chronic) (HCC)   AKI (acute kidney injury) ([place])   ARDS (adult respiratory distress syndrome) (HCC)   Acute deep vein thrombosis (DVT) of distal vein of lower extremity ([place])   Encounter for central line placement  Advanced Renal Cell Cancer   SUMMARY Philip Stafford was 65 y.o. Philip Stafford with    has no past medical history on file.   has no past surgical history on file.   Admitted on 03/18/2020 with    [name] is a 65 y.o. gentleman with no known PMHx who presented to the ED with c/o fever.   [name] states his first symptom was fever, approximately 102.1 occurred approximately 10 days ago.  In this time, he had also developed productive cough with occasionally small bloody streaks in his sputum.  He denies any difficulty breathing though.  He is also experiencing nausea when eating with approximately 1-2 episodes of nonbilious, nonbloody emesis.  He endorses diaphoresis, dizziness, blurry vision, generalized weakness, fatigue that all started since he became sick, but denies any chest pain, palpitations, abdominal pain, dysuria, hematuria, urinary frequency or urgency.  He endorses some back pain but feels it is muscular.  He notes that he came to the ED as his girlfriend has been checking his oxygen saturations at home and he was noted to be D satting.  He went to an urgent care on the December 15th, where he was prescribed azithromycin, decadron, tessalon pearls, hydroxyurea )for GI upset) and folic acid.   ED Course:  On arrival to the ED private car, initial oxygen saturation noted in triage was 38% that improved to 95% on nonrebreather.  Philip Stafford was afebrile at  97.7, normotensive at 132/83, heart rate of 74 with a respiratory rate of 22.  Initial CMP remarkable for acute renal failure with creatinine of 4.69, BUN of 79.  He also has a anion gap acidosis with CO2 of 19 and anion gap of 19, mild hyponatremia at 133, mildly elevated total bilirubin of 1.6.  CBC shows a mild normocytic anemia with hemoglobin of 12.8.  No leukocytosis. Inflammatory markers are elevated with LDH of 457, ferritin of 958, CRP of 15, fibrinogen of 720, D-dimer of 2.76.  Initial chest x-ray with left upper lobe airspace disease concerning for pneumonia, as well as bilateral diffuse interstitial thickening.  Covid test was obtained and positive.     Significant Hospital Events:  12/16 admit to internal medicine PCCM consult  12/17 Re-consult PCCM. Found to have R renal mass, favored to be perinephric hematoma, as well as acute DVT LLE. Not anticoagulated due to hematoma   12/20 Re-consult PCCM-- worsening hypoxia and AMS - More confused today-- reports of pt walking around room naked, taking off supplemental O2 etc . Increased O2 requirements:  HHFNC 40L and 1.0 NRB  ABG: 7.45/27.3/59.1 SpO2 94%  Reportedly desats quickly when O2 taken off or when moves.   Ddimer has been >20 since 12/19  CRP rising, is now 4.2 from 1.2 Ferritin 904   12/21 - ongoing confusion and agitation despite precedex gtt. On face mask with HHFNC and pulse ox 86% . RT says too agitated to tolerate biPAP. Currently moaning he wants  to "go home now". Think he is in [facility]   12/22 - 12/22 - On HHFNC and face mask. 60KL and 100%. Still confused and agitated. Redirectable. On percedex gtt and scheeduled haldol. On Posey. QTc 509 msec. MRI abdoment and IVC filter still on hold due to encephalopathy. Midline not working. RN recommends PICC  12/23 0- 90% fiop2, 45L flow, Face mask +. Precedex + . Philip Stafford admitted to etoh behavior + but daughter thinks this is cofnusion. Daughter says no way Philip Stafford  is drinking eoth since 2009.  NA 152 . Creat improving. PICC pending  - order +  . Haldol n hold due to long QTc yesterday, Toleating TF +  12/24 -  briefly used bipap yesterda morning. On precedex gtt. Fio2 100% on 55L HHFNC. -> overnight -> t his am reduced to 80% and 45L HHFNC CXR today same v better. Haldol on hold->  QTc improved to 446 msec  yesterday on ekg . On CIWA protocol since last night.  -. INTUBATED   12/25 - 50% fio2,/ 8 peep  Supine - on vent. . On diprivan, precedex gtt, fent gtt, levophed gtt low dose 1 mcg/min. Febrile overnight. WBC high but improved. Repeat CT 12/25 with possible sigmoid colon diveritculitis with micropeforation.  On CT: Rt Kidney still abnormal - bleed hematoma v renal cel cancer. MRI pending later today. Acidotic on bic gtt. UR op dropping. Hyperkalemic   03/21/2020 -  Back on IV heparin gtt yesterday for DVT. after MRI showed renal cell Cancer Large on right side with spine/pelivs mtts. Per URology Dr Cardell Peach - high risk Motzer criteria with median survival of  4 months expected. Not a surgical canddiate.  Onc for prognosis recommended and at some point renal bx recommended. Currently n CRRT. On d5 gtt, On bic gtt, On vent > 50% fio2 - supine. On fent gt, precedex gtt, leveophed gtt,    Detailed multiple family conversations held. Family understood very poor prognosis from COVID-19 and ARDS/Renal failure/Encephalopathy and Advanced renal cell cancer and opted for comfort care. Philip Stafford terminall weaned and expired 03/05/2020    SIGNED Dr. Kalman Shan, M.D., [facility].C.P Pulmonary and Critical Care Medicine Staff Physician [facility] [place] Pulmonary and Critical Care Pager: [phone], If no answer or between  15:00h - 7:00h: call [phone]  03/25/2020 7:52 AM

## 2020-03-28 NOTE — Progress Notes (Signed)
[place] Progress Note Patient Name: Jaydian Santana DOB: [birth year] MRN: [id number]   Date of Service  03/02/2020  HPI/Events of Note  Pharmacist notified about 1/4 blood culture + for staph epidermidis.  eICU Interventions  On comfort care. Continue same.      Intervention Category Minor Interventions: Other:  Elmer Sow 03/21/2020, 7:48 PM

## 2020-03-28 NOTE — Progress Notes (Addendum)
[place] CRITICAL VALUE ALERT - BLOOD CULTURE IDENTIFICATION (BCID)  Philip Stafford is an 65 y.o. male who presented to [facility] on 03/19/2020 with a chief complaint of COVID pneumonia with ARDS.  Assessment:  1/4 BCx growing staph epi with MecA resistance. Likely contaminant. Patient transitioned to comfort care.  Name of physician (or Provider) Contacted: Dr. Prudencio Burly  Current antibiotics: None  Changes to prescribed antibiotics recommended: No changes needed   Results for orders placed or performed during the hospital encounter of 03-19-20  Blood Culture ID Panel (Reflexed) (Collected: 03/21/2020  2:29 PM)  Result Value Ref Range   Enterococcus faecalis NOT DETECTED NOT DETECTED   Enterococcus Faecium NOT DETECTED NOT DETECTED   Listeria monocytogenes NOT DETECTED NOT DETECTED   Staphylococcus species DETECTED (A) NOT DETECTED   Staphylococcus aureus (BCID) NOT DETECTED NOT DETECTED   Staphylococcus epidermidis DETECTED (A) NOT DETECTED   Staphylococcus lugdunensis NOT DETECTED NOT DETECTED   Streptococcus species NOT DETECTED NOT DETECTED   Streptococcus agalactiae NOT DETECTED NOT DETECTED   Streptococcus pneumoniae NOT DETECTED NOT DETECTED   Streptococcus pyogenes NOT DETECTED NOT DETECTED   A.calcoaceticus-baumannii NOT DETECTED NOT DETECTED   Bacteroides fragilis NOT DETECTED NOT DETECTED   Enterobacterales NOT DETECTED NOT DETECTED   Enterobacter cloacae complex NOT DETECTED NOT DETECTED   Escherichia coli NOT DETECTED NOT DETECTED   Klebsiella aerogenes NOT DETECTED NOT DETECTED   Klebsiella oxytoca NOT DETECTED NOT DETECTED   Klebsiella pneumoniae NOT DETECTED NOT DETECTED   Proteus species NOT DETECTED NOT DETECTED   Salmonella species NOT DETECTED NOT DETECTED   Serratia marcescens NOT DETECTED NOT DETECTED   Haemophilus influenzae NOT DETECTED NOT DETECTED   Neisseria meningitidis NOT DETECTED NOT DETECTED   Pseudomonas  aeruginosa NOT DETECTED NOT DETECTED   Stenotrophomonas maltophilia NOT DETECTED NOT DETECTED   Candida albicans NOT DETECTED NOT DETECTED   Candida auris NOT DETECTED NOT DETECTED   Candida glabrata NOT DETECTED NOT DETECTED   Candida krusei NOT DETECTED NOT DETECTED   Candida parapsilosis NOT DETECTED NOT DETECTED   Candida tropicalis NOT DETECTED NOT DETECTED   Cryptococcus neoformans/gattii NOT DETECTED NOT DETECTED   Methicillin resistance mecA/C DETECTED (A) NOT DETECTED   Benetta Spar, PharmD, BCPS, [place] Clinical Pharmacist  Please check AMION for all [place] phone numbers After 10:00 PM, call [place]

## 2020-03-28 NOTE — Progress Notes (Signed)
ANTICOAGULATION CONSULT NOTE  Pharmacy Consult for heparin Indication: DVT  No Known Allergies  Patient Measurements: Height: 5\' 7"  (170.2 cm) Weight: 90.3 kg (199 lb 1.2 oz) IBW/kg (Calculated) : 66.1  Heparin dosing wt: 84 kg   Temp: 96.1 F (35.6 C) (12/25 2330) Temp Source: Axillary (12/25 2330) Pulse Rate: 68 (12/26 0300)  Labs: Recent Labs    03/20/20 2307 03/21/20 0119 03/21/20 0139 03/21/20 0422 03/21/20 0733 03/21/20 1558 03/21/20 2359 Apr 04, 2020 0233  HGB 12.5*  --    < > 10.9* 11.5*  --  11.0*  --   HCT 41.1  --    < > 32.0* 39.4  --  36.2*  --   PLT 148*  --   --   --  138*  --  125*  --   HEPARINUNFRC  --   --   --   --   --   --   --  0.13*  CREATININE  --  2.85*  --   --  3.14* 2.78*  --   --    < > = values in this interval not displayed.    Estimated Creatinine Clearance: 28.8 mL/min (A) (by C-G formula based on SCr of 2.78 mg/dL (H)).   Assessment: 64yom admitted with COVID pneumonia.  Now found to have LLE DVT.  Anticoagulation initially held d/t concern for nephritic hematoma > found to renal cell carcinoma. Okay to begin anticoagulation.  Heparin level subtherapeutic (0.13) on gtt at 1250 units/hr. Plt down to 125, Hgb down to 11. No issues with line or bleeding reported per RN.  Goal of Therapy:  Heparin level 0.3-0.7 units/ml Monitor platelets by anticoagulation protocol: Yes   Plan:  Bolus heparin 2500 units Increase heparin gtt to 1500 units/hr F/u 8 hr heparin level  Sherlon Handing, PharmD, BCPS Please see amion for complete clinical pharmacist phone list 2020-04-04 3:32 AM

## 2020-03-28 NOTE — Progress Notes (Signed)
ANTICOAGULATION CONSULT NOTE  Pharmacy Consult for heparin Indication: DVT  No Known Allergies  Patient Measurements: Height: 5\' 7"  (170.2 cm) Weight: 92.9 kg (204 lb 12.9 oz) IBW/kg (Calculated) : 66.1  Heparin dosing wt: 84 kg   Temp: 98.9 F (37.2 C) (12/26 0750) Temp Source: Axillary (12/26 0750) Pulse Rate: 82 (12/26 1100)  Labs: Recent Labs    03/20/20 2307 03/21/20 0119 03/21/20 0733 03/21/20 1558 03/21/20 2359 03/10/2020 0233 03/03/2020 0441 02/28/2020 0454 03/03/2020 1112  HGB 12.5*   < > 11.5*  --  11.0*  --  11.2*  --   --   HCT 41.1   < > 39.4  --  36.2*  --  33.0*  --   --   PLT 148*  --  138*  --  125*  --   --   --   --   HEPARINUNFRC  --   --   --   --   --  0.13*  --   --  0.33  CREATININE  --    < > 3.14* 2.78*  --   --   --  2.21*  --    < > = values in this interval not displayed.    Estimated Creatinine Clearance: 36.7 mL/min (A) (by C-G formula based on SCr of 2.21 mg/dL (H)).   Assessment: 64yom admitted with COVID pneumonia.  Now found to have LLE DVT.  Anticoagulation initially held d/t concern for nephritic hematoma > found to renal cell carcinoma. Okay to begin anticoagulation.  Heparin level 0.33 is therapeutic on heparin 1500 units/hr.CBC ok. No reported bleeding per RN.   Goal of Therapy:  Heparin level 0.3-0.7 units/ml Monitor platelets by anticoagulation protocol: Yes   Plan:  Increase heparin slightly to 1550 units/hr to ensure remains in therapeutic range  Heparin level at 2100  Monitor heparin level, CBC and S/s of bleeding daily   Cristela Felt, PharmD Clinical Pharmacist  02/27/2020 12:20 PM

## 2020-03-28 NOTE — Progress Notes (Signed)
NAME:  Cleotha Arriaza, MRN:  [id number], DOB:  03-25-1956, LOS: 9 ADMISSION DATE:  03/21/2020, CONSULTATION DATE:  04/05/2020 REFERRING MD:  Charleen Kirks, CHIEF COMPLAINT:  Hypoxia   Brief History:   Mr. Cepin is a 65 year old male who does not follow with primary care and has a history of 70 pack years of tobacco use  (quit 11 yrs ago) who presents with fever up to 102F at home.  He had been diagnosed with Covid-19 approximately 4 days ago.  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  He has had some cough and emesis.  He was prescribed azithromycin by urgent care.  On arrival to the ED his oxygen saturations were very low, 38% though patient was awake so unlikely to be accurate.  He was placed on nonrebreather.  Work-up was significant for likely left upper lobe pneumonia and diffuse interstitial thickening with elevated inflammatory markers and a creatinine of 4.6.  Oxygen requirements increased to 40 L with PO2 of 48 on ABG, so PCCM was consulted.  On evaluation, reportedly patient's oxygen tank had run out at the time of a prior ABG.  He is currently awake and alert on 15 L nonrebreather without any respiratory distress or complaints.  Past Medical History:  Tobacco use   has no past medical history on file.  has no past surgical history on file.   There is no immunization history on file for this patient.   Significant Hospital Events:  12/16 admit to internal medicine PCCM consult  12/17 Re-consult PCCM. Found to have R renal mass, favored to be perinephric hematoma, as well as acute DVT LLE. Not anticoagulated due to hematoma   12/20 Re-consult PCCM-- worsening hypoxia and AMS - More confused today-- reports of pt walking around room naked, taking off supplemental O2 etc . Increased O2 requirements:  HHFNC 40L and 1.0 NRB  ABG: 7.45/27.3/59.1 SpO2 94%  Reportedly desats quickly when O2 taken off or when moves.   Ddimer has been >20 since 12/19   CRP rising, is now 4.2 from 1.2 Ferritin 904   12/21 - ongoing confusion and agitation despite precedex gtt. On face mask with HHFNC and pulse ox 86% . RT says too agitated to tolerate biPAP. Currently moaning he wants to "go home now". Think he is in [facility]   12/22 - 12/22 - On HHFNC and face mask. 60KL and 100%. Still confused and agitated. Redirectable. On percedex gtt and scheeduled haldol. On Posey. QTc 509 msec. MRI abdoment and IVC filter still on hold due to encephalopathy. Midline not working. RN recommends PICC  12/23 0- 90% fiop2, 45L flow, Face mask +. Precedex + . Patient admitted to etoh behavior + but daughter thinks this is cofnusion. Daughter says no way patient is drinking eoth since 2009.  NA 152 . Creat improving. PICC pending  - order +  . Haldol n hold due to long QTc yesterday, Toleating TF +  12/24 -  briefly used bipap yesterda morning. On precedex gtt. Fio2 100% on 55L HHFNC. -> overnight -> t his am reduced to 80% and 45L HHFNC CXR today same v better. Haldol on hold->  QTc improved to 446 msec  yesterday on ekg . On CIWA protocol since last night.  -. INTUBATED   12/25 - 50% fio2,/ 8 peep  Supine - on vent. . On diprivan, precedex gtt, fent gtt, levophed gtt low dose 1 mcg/min. Febrile overnight. WBC high but  improved. Repeat CT 12/25 with possible sigmoid colon diveritculitis with micropeforation.  On CT: Rt Kidney still abnormal - bleed hematoma v renal cel cancer. MRI pending later today. Acidotic on bic gtt. UR op dropping. Hyperkalemic  Consults:  PCCM Renal 12/25  Procedures:  Midline ? Date ?? - came out ? 03/19/20 PICC ordered 12/22  cortrak 12/22 ETT 12/24 -  R radial a line 12/24 -    Significant Diagnostic Tests:  TTE 12/17> LVEF 60-65% normal LV function. Normal RV size and systolic function.  LE dopplers venous - LLE DVT    12/16 CT a/p noncon> extensive GGO. 2 pulm nodules at bilateral lung bases up to 67mm. Moderate to large  perinephric collection, suspected hematoma. Mild R hydronephrosis  1. Asymmetrically enlarged right kidney, suspect moderate to large right perinephric collection. Collection appears slightly dense suggestive of perirenal hematoma though further characterisation is limited without contrast. There also appears to be mild right hydronephrosis. There is no ureteral stone. 2. Extensive ground-glass density at the bilateral lung bases, favored to represent pneumonia with other differential considerations including edema and hemorrhage. There are 2 pulmonary nodules at the bilateral lung bases measuring up to 12 mm on the left, nodules could be infectious, inflammatory, or neoplastic in etiology. 3. Sigmoid colon diverticular disease without acute inflammatory change. 4. Aortic atherosclerosis.  Aortic Atherosclerosis (ICD10-I70.0).  Electronically Signed: By: Donavan Foil M.D. 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    Micro Data:  12/16 Covid-19>> positive xxx 12/25 - blood 12/25 ua 12/25 - resp   Antimicrobials:   azithromycin 12/16-12/20 Ceftriaxone 12/16-12/20  xxx covid Rx - decadrom 12/17 -  12/25 (? Gi perf)_ - barcitinib ? Start date - 12/25 (CT abd with evidence of diverticulituis +/- micropef_ xxxxx CEfepime 12/25 (CT abd with microperf sig colon) falgyl 12/25 (CT abd with microperf sig colon)  Interim History / Subjective:    03-23-2020 -  Back on IV heparin gtt yesterday for DVT. after MRI showed renal cell Cancer Large on right side with spine/pelivs mtts. Per URology Dr Abner Greenspan - high risk Motzer criteria with median survival of  4 months expected. Not a surgical canddiate.  Onc for prognosis recommended and at some point renal bx recommended  Currently n CRRT. On d5 gtt, On bic gtt, On vent > 50% fio2 - supine. On fent gt, precedex gtt, leveophed gtt,   Objective   Blood  pressure (!) 86/56, pulse 88, temperature 98.9 F (37.2 C), temperature source Axillary, resp. rate (!) 27, height 5\' 7"  (1.702 m), weight 92.9 kg, SpO2 90 %.    Vent Mode: PRVC FiO2 (%):  [50 %] 50 % Set Rate:  [32 bmp] 32 bmp Vt Set:  [520 mL] 520 mL PEEP:  [8 cmH20] 8 cmH20 Plateau Pressure:  [19 cmH20-21 cmH20] 20 cmH20   Intake/Output Summary (Last 24 hours) at March 23, 2020 1027 Last data filed at Mar 23, 2020 0900 Gross per 24 hour  Intake 6120.73 ml  Output 5208 ml  Net 912.73 ml   Filed Weights   03/20/20 0500 03/21/20 0500 03/23/2020 0337  Weight: 84.5 kg 90.3 kg 92.9 kg   General Appearance:  Looks criticall ill  Head:  Normocephalic, without obvious abnormality, atraumatic Eyes:  PERRL - yes, conjunctiva/corneas - muddy Ears:  Normal external ear canals, both ears Nose:  G tube - no Throat:  ETT TUBE - yes , OG  tube - no Neck:  Supple,  No enlargement/tenderness/nodules Lungs: Clear to auscultation bilaterally, Ventilator   Synchrony - yes, 50% fiop2, peep 8 Heart:  S1 and S2 normal, no murmur, CVP - no.  Pressors - levophed Abdomen:  Soft, no masses, no organomegaly Genitalia / Rectal:  Not done Extremities:  Extremities- intact Skin:  ntact in exposed areas . Sacral area - not examined Neurologic:  Sedation - fent gtt, precedex gtt -> RASS - -4   Resolved Hospital Problem list     Assessment & Plan:  COVID-19 - sp  barcitinb and decadron ending 03/21/20 due to gi perforation on CT abd 03/21/20  Plan  -clinically monitor   Hx of heavy smoking at baseline but no known resp dissues Acute hypoxemic respiratory failure 2/2 COVID-19 PNA - s/p intubation 12/24   March 09, 2020 - > does not meet criteria for SBT/Extubation in setting of Acute Respiratory Failure due to covid 19, renal failure, sedation and pressor needs   Plan  -  PRVC - vAP bundle   Confirmed LLE DVT with concern for possible PE with -has not started on systemic anticoag or on vte ppx  in  setting of suspected renal perinephirc hematoma below  known DVT, worsening hypoxia.  -LLE DVT 12/16 -ECHO 12/17 without evidence of R heart failure. Has not gone for CTA first because of AKI,  -has not been on VTE ppx or systemic anticoagulation given perinephric hematoma concern on amdit CT -> MRI 12/25 showed Rt renal cell cancer and NOT hematoma  March 09, 2020  On IV heparin gtt  P -monitor with IV heparin gtt    Suspected perinephric hematoma at admit -> diagnosis changed to Rt Renal Cell Cancer with -seen by urology and nephrology   - 12/25 Urology - pooor median survival of 4 months. Not a candidate for nephrectomy  March 09, 2020 - ppoor prognosis   Plan  -might need onc to prognosticate - renal biopsy based on goals of care and recovery  Acute encephalopathy -suspect 2/2 hypoxia, possible delirium, CNS depressing meds -is at risk for CVA with known DVT, hypercoagulable state   March 09, 2020 -now sedated on ventilator with fent gtt and precedex gtt  P  - precedex gtt  - continue fent gtt  - no need for haldol - RASS goal 0 to -3  AKI with severe Metabolic acidosis and Hyperkalemia - started CRRT 12/25  March 09, 2020  - per renal this is due to medical issus and not renal cell cancer  plan -- CRRT per renal (they will dc d5 and bic gtt)    Concern for diveritculitis on CT 03/21/20  March 09, 2020 - abd  continues to be soft  (OFF TF since 12/25 due to MRI cortrak removed)  Plan Conitnue cefepime Conitnue flagyl   Circulatry shock due to sepsis, post intubation and acidemia from renal failure - onset 03/21/20  ,March 09, 2020 - remains on levophed  Plan - levophed for MAP goal > 65  Anemia of critical lillness  March 09, 2020 - no active bleed  Plan - - PRBC for hgb </= 6.9gm%    - exceptions are   -  if ACS susepcted/confirmed then transfuse for hgb </= 8.0gm%,  or    -  active bleeding with hemodynamic instability, then transfuse regardless of hemoglobin value    At at all times try to transfuse 1 unit prbc as possible with exception of active hemorrhage    Goals of Care:   12/17 PCCM attempted GOC conversation-- patient was reportedly distant and when  asked about his feelings if condition were to worsen said he just hopes he gets better. Remains full code  12/21 -  Called daughter Richmond Campbell Scales [phone] Hassan Rowan Gorden Harms is girl friend - per daughter - Hassan Rowan should not get information because of communication barrier MD -> layperson term).   12/22 - updated  12/23 - updated  12/24 - updated daughter indicated heading towards intubation . She indicated full code  12/25 - updated daughter. Given extensive update about MRI., CRRT - and holding feeds. She wants full active care. Discussed code status in case of arrest-> she indicated full medical care but agreed to no CPR in case of cardiac arrest. Consented for HD cath   12/26- called daughter Acie Scales [phone]. Detailed discussion on prognosis. Explained that prognosis is very poor from covid and on top of all this we are now  dealing with advanced renal cancer. Explained that transition to comfort is very appropriate. Daughter is understanding and in acceptance. She wishes for her and rest of the family to visit so they can come to terms and make a decision about comfort oriented care approach  [facility]   Diet: NPO, sips with meds -> cortrak 03/18/2020 and then TF -> stopped 12/25 due to GI perf and cortrak removed for MRI -> remains NPO 12/26 PAD: n/a VAP: n/a  DVT  I V heparin snce 12/25 for DVT  GI ppx: pepcid (on steroids)  -> change to IV protonix 12/26 Glucose monitoring: monitoring Mobility: bed rest supine vent Family Update:\  daughter Richmond Campbell Scales [phone] Hassan Rowan Gorden Harms is girl friend and per daughter - Hassan Rowan should not get information because of communication barrier MD -> layperson term).  Code Status: Full -  Dispo: ICU under CCM (IMTS when out of ICU)  service     [place]   The patient Esther Broyles is critically ill with multiple organ systems failure and requires high complexity decision making for assessment and support, frequent evaluation and titration of therapies, application of advanced monitoring technologies and extensive interpretation of multiple databases.   Critical Care Time devoted to patient care services described in this note is  40  Minutes. This time reflects time of care of this signee Dr Brand Males. This critical care time does not reflect procedure time, or teaching time or supervisory time of PA/NP/Med student/Med Resident etc but could involve care discussion time     Dr. Brand Males, M.D., [facility].C.P Pulmonary and Critical Care Medicine Staff Physician [facility] Pager: [phone], If no answer or between  15:00h - 7:00h: call [phone]  13-Apr-2020 11:07 AM     LABS    PULMONARY Recent Labs  Lab 03/20/20 1816 03/20/20 2101 03/21/20 0139 03/21/20 0422 Apr 13, 2020 0441  PHART 7.110* 7.191* 7.195* 7.220* 7.369  PCO2ART 73.7* 57.9* 60.4* 56.8* 54.3*  PO2ART 111* 173* 198* 151* 85  HCO3 23.4 22.2 23.2 23.3 31.5*  TCO2 26 24 25 25  33*  O2SAT 96.0 99.0 99.0 99.0 96.0    CBC Recent Labs  Lab 03/20/20 2307 03/21/20 0139 03/21/20 0733 03/21/20 2359 April 13, 2020 0441  HGB 12.5*   < > 11.5* 11.0* 11.2*  HCT 41.1   < > 39.4 36.2* 33.0*  WBC 15.2*  --  13.9* 14.1*  --   PLT 148*  --  138* 125*  --    < > = values in this interval not  displayed.    COAGULATION No results for input(s): INR in the last 168 hours.  CARDIAC  No results for input(s): TROPONINI in the last 168 hours. No results for input(s): PROBNP in the last 168 hours.   CHEMISTRY Recent Labs  Lab 03/18/20 1226 03/19/20 0601 03/20/20 0500 03/20/20 1428 03/20/20 2153 03/21/20 0119 03/21/20 0139 03/21/20 0422 03/21/20 0527 03/21/20 0733  03/21/20 1427 03/21/20 1558 03/21/20 1856 2020-04-21 0441 04/21/20 0454  NA 152* 152* 155*   < > 154* 153*   < > 155*  --  155*  --  151*  --  143 142  K 5.0 5.2* 6.0*   < > >7.5* 7.1*   < > 6.4*   < > 6.6*   < > 4.8   < > 4.3 4.3  CL 121* 124* 126*  --  126* 124*  --   --   --  119*  --  115*  --   --  103  CO2 18* 18* 17*  --  21* 21*  --   --   --  21*  --  24  --   --  27  GLUCOSE 148* 168* 147*  --  153* 186*  --   --   --  224*  --  171*  --   --  137*  BUN 59* 69* 79*  --  96* 102*  --   --   --  110*  --  94*  --   --  56*  CREATININE 1.54* 1.45* 1.82*  --  2.56* 2.85*  --   --   --  3.14*  --  2.78*  --   --  2.21*  CALCIUM 9.0 8.9 9.2  --  8.6* 9.2  --   --   --  9.4  --  8.1*  --   --  7.8*  MG 3.2* 3.4* 3.2*  --   --  3.5*  --   --   --   --   --   --   --   --  2.5*  PHOS 4.6 4.1 3.4  --   --  8.6*  --   --   --   --   --  6.7*  --   --  3.9   < > = values in this interval not displayed.   Estimated Creatinine Clearance: 36.7 mL/min (A) (by C-G formula based on SCr of 2.21 mg/dL (H)).   LIVER Recent Labs  Lab 03/18/20 1226 03/19/20 0601 03/20/20 0500 03/21/20 0119 03/21/20 1558 2020/04/21 0454  AST 26 102* 380* 93*  --  44*  ALT 23 76* 311* 183*  --  124*  ALKPHOS 86 115 210* 156*  --  136*  BILITOT 2.2* 0.9 3.0* 1.4*  --  0.9  PROT 6.6 6.2* 6.3* 5.4*  --  5.6*  ALBUMIN 2.5* 2.3* 2.3* 1.9* 1.7* 1.9*     INFECTIOUS Recent Labs  Lab 03/21/20 0527 03/21/20 0849 04-21-20 0454  LATICACIDVEN 1.4 2.5* 1.9  PROCALCITON  --  1.38 0.85     ENDOCRINE CBG (last 3)  Recent Labs    03/21/20 2330 04/21/2020 0324 04-21-2020 0805  GLUCAP 177* 137* 115*         IMAGING x48h  - image(s) personally visualized  -   highlighted in bold CT ABDOMEN PELVIS WO CONTRAST  Result Date: 03/21/2020 CLINICAL DATA:  Acute renal failure. EXAM: CT ABDOMEN AND PELVIS WITHOUT CONTRAST TECHNIQUE: Multidetector CT  imaging of the abdomen and pelvis was performed following the  standard protocol without IV contrast. COMPARISON:  03/12/2020 FINDINGS: Lower chest: The diffuse interstitial and alveolar ground-glass opacity seen previously shows progression in the dependent lung bases with more confluent consolidative opacity today. Hepatobiliary: No focal abnormality in the liver on this study without intravenous contrast. There is no evidence for gallstones, gallbladder wall thickening, or pericholecystic fluid. No intrahepatic or extrahepatic biliary dilation. Pancreas: No focal mass lesion. No dilatation of the main duct. No intraparenchymal cyst. No peripancreatic edema. Spleen: No splenomegaly. No focal mass lesion. Adrenals/Urinary Tract: No adrenal nodule or mass. Abnormal appearance of right kidney is stable in the interval. Left kidney unremarkable. No hydroureteronephrosis. Bladder decompressed by Foley catheter. Stomach/Bowel: Stomach is nondistended. Feeding catheter is identified with tip transpyloric in the second portion of the duodenum. No small bowel wall thickening. No small bowel dilatation. The terminal ileum is normal. The appendix is not visualized, but there is no edema or inflammation in the region of the cecum. Diverticular changes are most prominent in the left colon with interval development of focal wall thickening in the mid sigmoid segment associated with pericolonic edema/inflammation. Imaging features are most suggestive of diverticulitis. There is some edema in the sigmoid mesocolon without rim enhancing/organized abscess at this time. Tiny gas bubbles seen along the posterior wall of the sigmoid colon on image 75/3 are probably in folds or diverticuli, although contained micro perforation not entirely excluded. Vascular/Lymphatic: There is abdominal aortic atherosclerosis without aneurysm. There is no gastrohepatic or hepatoduodenal ligament lymphadenopathy. No retroperitoneal or mesenteric lymphadenopathy. No pelvic sidewall lymphadenopathy. Reproductive:  The prostate gland and seminal vesicles are unremarkable. Other: No intraperitoneal free fluid. Musculoskeletal: No worrisome lytic or sclerotic osseous abnormality. IMPRESSION: 1. Interval development of focal wall thickening in the mid sigmoid colon with pericolonic edema/inflammation. Imaging features are most suggestive of diverticulitis. Tiny gas bubbles seen along the posterior wall of the sigmoid colon are probably intraluminal although contained micro perforation not entirely excluded. No evidence for abscess. 2. Interval progression of diffuse interstitial and alveolar ground-glass opacity seen previously with more confluent consolidative opacity today. Imaging features are compatible with worsening edema or pneumonia. 3. Stable abnormal masslike distortion of the right kidney. This may be related to subcapsular hematoma although renal cell carcinoma could certainly have this appearance. Close follow-up recommended. 4. No evidence for hydroureteronephrosis in this patient with worsening renal failure. 5. Aortic Atherosclerosis (ICD10-I70.0). Electronically Signed   By: Misty Stanley M.D.   On: 03/21/2020 05:57   DG Abd 1 View  Result Date: 03/20/2020 CLINICAL DATA:  Enteric tube placement EXAM: ABDOMEN - 1 VIEW COMPARISON:  None. FINDINGS: The enteric tube tip projects over the gastric antrum/pylorus. The tip is pointed distally. The visualized bowel gas pattern is nonobstructive. IMPRESSION: Enteric tube projects over the gastric antrum/pylorus. Electronically Signed   By: Constance Holster M.D.   On: 03/20/2020 17:36   MR ABDOMEN W WO CONTRAST  Result Date: 03/21/2020 CLINICAL DATA:  Renal mass, renal insufficiency, concern for hematoma EXAM: MRI ABDOMEN WITHOUT AND WITH CONTRAST TECHNIQUE: Multiplanar multisequence MR imaging of the abdomen was performed both before and after the administration of intravenous contrast. CONTRAST:  45mL GADAVIST GADOBUTROL 1 MMOL/ML IV SOLN COMPARISON:  CT  abdomen pelvis, 03/21/2020, 03/04/2020 FINDINGS: Lower chest: Bibasilar heterogeneous airspace disease. Hepatobiliary: There are multiple, ill-defined transient peripheral subcapsular hyperenhancing foci of the liver parenchyma (series 18, image 80 123). No definite mass or other parenchymal abnormality identified. Pancreas:  No mass, inflammatory changes, or other parenchymal abnormality identified. Spleen:  Within normal limits in size and appearance. Adrenals/Urinary Tract: There is a large mass with heterogeneous intrinsic signal and extensive, heterogeneous contrast enhancement about the periphery of the right kidney, which although unusual and somewhat perinephric in appearance clearly violate the normal contour of the renal cortex and demonstrates a much greater degree of contrast enhancement thancould ascribed to a perinephric hematoma (series 22, image 38, series 24, image 33). Suspect direct involvement of the adjacent right adrenal gland and inferior vena cava (series 22, image 30). Overall dimensions of the mass, although difficult to distinguish from the normal kidney, are approximately 13.5 x 11.4 x 6.9 cm. The left kidney and adrenal gland are normal no evidence of hydronephrosis. Stomach/Bowel: Inflammatory findings about the sigmoid colon are not well imaged, seen only on select coronal images (series 24, image 51). Vascular/Lymphatic: No pathologically enlarged lymph nodes identified. Aortic atherosclerosis. No abdominal aortic aneurysm demonstrated. Other:  None. Musculoskeletal: There are numerous abnormally contrast enhancing lesions noted throughout the included spine and pelvis (e.g. Series 24, image 31, 34). IMPRESSION: 1. Large mass with heterogeneous intrinsic signal and extensive, heterogeneous contrast enhancement about the periphery of the right kidney, which although unusual and somewhat perinephric in appearance clearly violates the normal contour of the renal cortex and demonstrates a  much greater degree of contrast enhancement density compatible with a bland perinephric hematoma. Findings are consistent with a large renal cell carcinoma, although difficult to accurately measure approximately 13.5 x 11.4 x 6.9 cm. Suspect direct involvement of the adjacent right adrenal gland and inferior vena cava. 2. Numerous abnormally contrast enhancing lesions throughout the included spine and pelvis, concerning for widespread osseous metastatic disease. 3. There are multiple, ill-defined transient peripheral subcapsular hyperenhancing foci of the liver parenchyma. There is no definite mass or other parenchymal abnormality identified. These most likely reflect transient hepatic intensity differences. Hepatic metastatic disease is unusual in renal cell carcinoma and not favored. 4. No lymphadenopathy in the abdomen. 5. Inflammatory findings about the sigmoid colon are not well imaged, seen only on select coronal images and better assessed by same-day CT. Aortic Atherosclerosis (ICD10-I70.0). Electronically Signed   By: Eddie Candle M.D.   On: 03/21/2020 11:58   DG Chest Port 1 View  Result Date: 03/21/2020 CLINICAL DATA:  Central line placement EXAM: PORTABLE CHEST 1 VIEW COMPARISON:  Portable exam 1305 hours compared to 0518 hours FINDINGS: Tip of endotracheal tube projects 1.2 cm above carina, recommend withdrawal 1-2 cm. RIGHT jugular central venous catheter with tip projecting over SVC. Normal heart size, mediastinal contours, and pulmonary vascularity. Persistent bibasilar infiltrates persist. No pleural effusion or pneumothorax. Osseous structures unremarkable. IMPRESSION: Persistent bibasilar infiltrates. No pneumothorax following RIGHT jugular line placement. Recommend withdrawal of endotracheal tube 1-2 cm. Findings called to patient's nurse RN in MICU on 03/21/2020 at 1313 hours. Electronically Signed   By: Lavonia Dana M.D.   On: 03/21/2020 13:17   DG CHEST PORT 1 VIEW  Result Date:  03/21/2020 CLINICAL DATA:  Endotracheal tube EXAM: PORTABLE CHEST 1 VIEW COMPARISON:  Yesterday FINDINGS: Endotracheal tube with tip halfway between the clavicular heads and carina. Feeding tube that at least reaches the stomach. Bilateral airspace disease. Hyperinflation and emphysema. No visible effusion or pneumothorax. Normal heart size. IMPRESSION: 1. Stable hardware positioning. 2. Stable pneumonia superimposed on emphysema. Electronically Signed   By: Monte Fantasia M.D.   On: 03/21/2020 07:12   DG CHEST PORT 1 VIEW  Result  Date: 03/20/2020 CLINICAL DATA:  Intubation EXAM: PORTABLE CHEST 1 VIEW COMPARISON:  Chest x-ray 12242 5:26 a.m. FINDINGS: Interval placement of endotracheal tube with tip terminating 7 cm above the carina. An enteric tube is noted to course below the diaphragm with tip collimated off view. The guidewire is noted still present within the enteric tube. The heart size and mediastinal contours are within normal limits. Persistent bilateral patchy airspace opacities. No pulmonary edema. No pleural effusion. No pneumothorax. No acute osseous abnormality. IMPRESSION: 1. Interval placement of an endotracheal tube with tip 7 cm above the carina. Consider advancing by 1-2 cm. 2. Similar appearing multifocal pneumonia. Electronically Signed   By: Iven Finn M.D.   On: 03/20/2020 17:37

## 2020-03-28 NOTE — Progress Notes (Signed)
     Interdisciplinary Goals of Care Family Meeting   Date carried out:: 03/07/2020  Location of the meeting: Conference room  Member's involved: Physician, Family Member or next of kin and Other: brother, sister, brother in law, girl friend, and dpoa daughter. One other male family member  Durable Power of Tour manager: Daughter    Discussion: We discussed goals of care for [name] .   - explaiend advanced renal cell cancer with mets + COVID + MODS  Code status: Full DNR + but continue current activ care. They had questions about how terminal wean would look like  TERMINAL WEAN DISCUSSION  Explained concept of terminal wean  Explained how terminal wean works at the bedside Explained MD, RN, and RT role in this Explained that we are only stopping medicines and therapies  that are not effective and are only prolonging her suffering Explained that vent and pressors are not consistent with her goals Explained that we are still caring for patient by providing care aimed at comfort, agony, pain, distress  Explained post terminal wean we allow nature to take course    Explain predicting death post terminal is unpredictable but in patient Philip Stafford with 1955-06-29 and [place] [id number] . However, likely to happen in *minut=es  Explained they can time the event based on personal needs and family/friends to gather  Explained that medicines used for comfort are morphine and benzo and explained the doctrine of double effect  They are appreciative and will decice likely terminal wean   when family gather    Disposition: Continue current acute care  Time spent for the meeting: 5 min   SIGNATURE    Dr. Brand Males, M.D., F.C.C.P,  Pulmonary and Critical Care Medicine Staff Physician, [place] Director - Interstitial Lung Disease  Program  Pulmonary [facility] [place], [place], [address]  Pager: [phone], If no answer  OR between  19:00-7:00h: page [phone]  [phone] Telephone (clinical office): [phone] Telephone (research): [phone]  2:51 PM 02/28/2020

## 2020-03-28 NOTE — Progress Notes (Signed)
   Recall by the family to bedside conference room.  Met again with the daughter, sister, brother, brother-in-law and girlfriend of the patient.  One of the family member also present.  They have now visited with the patient at the bedside.  They said using best interest and substituted judgment the best course of action is to proceed with comfort.  They understand terminal wean and the fact that patient could pass away soon.  They understand the measures to keep the patient comfortable.  Terminal wean orders written patient is DNR.  They requested at least 3 family members be at the bedside prior to terminal wean.  I passed this on to the nurse.   Additional 45 minutes critical care time      SIGNATURE    Dr. Brand Males, M.D., F.C.C.P,  Pulmonary and Critical Care Medicine Staff Physician, [place] Director - Interstitial Lung Disease  Program  Pulmonary [facility] [place], [place], [address]  Pager: [phone], If no answer  OR between  19:00-7:00h: page [phone] Telephone (clinical office): [phone] Telephone (research): [phone]  5:15 PM 03-27-20

## 2020-03-28 NOTE — Progress Notes (Signed)
Subjective:  Got started on CRRT yesterday-  Potassium corrected as well as sodium - MRI unfortunately showing metastatic RCC with osseous involvement-  UOP dropping off   Objective Vital signs in last 24 hours: Vitals:   05-Apr-2020 0734 04-05-2020 0750 2020/04/05 0800 04/05/20 0900  BP:      Pulse:   94 88  Resp:   (!) 23 (!) 27  Temp:  98.9 F (37.2 C)    TempSrc:  Axillary    SpO2: 90%  91% 90%  Weight:      Height:       Weight change: 2.6 kg  Intake/Output Summary (Last 24 hours) at 2020-04-05 1004 Last data filed at 04/05/2020 0900 Gross per 24 hour  Intake 6120.73 ml  Output 5208 ml  Net 912.73 ml    Assessment/Plan:  65 year old male smoker with symptomatic covid - decompensation in the course of hosp, now with intubation and pressors-  Had AKI-   Improved but now recurred with hyperkalemia 1.Renal- AKI upon admit in the setting of COVID but also possibly perinephric bleed-  Did improve- crt nadir 1.4 until again started to decompensate medically and developed resp failure/ hypotension and had recurrence of AKI likely due to ATN-   then with hyperkalemia-  initiated supportive CRRT on 12/25- running well - will stop bicarb infusion- is not needed-  Now all 4 K baths, no heparin with CRRT but now on systemic for DVT and presumed PE 2. Hypertension/volume  - in review of chart not seeming to be volume overloaded-     running even with CRRT- OK to bolus outside of CRRT if needed - no heparin but will use oxaris filter  3. Hypernatremia-  resolved, will dec d5w as may be needed some for sugar 4. Hyperkalemia-  resolved, changed to 4 k bags 5. Anemia  - not a major issue as of yet- anticoagulation started for DVT 6. VDRF-  Per CCM-  now on AC due to DVT and presumed PE 7. ID-  Per CCM-  Started cefepime/flagyl  8. Metastatic RCC-  New finding and not good.  Would not be a candidate for any chemo with COVID right now- may change the plans given his poor outlook    Cecille Aver    Labs: Basic Metabolic Panel: Recent Labs  Lab 03/21/20 0119 03/21/20 0139 03/21/20 0733 03/21/20 1427 03/21/20 1558 03/21/20 1856 April 05, 2020 0441 04-05-2020 0454  NA 153*   < > 155*  --  151*  --  143 142  K 7.1*   < > 6.6*   < > 4.8 4.3 4.3 4.3  CL 124*  --  119*  --  115*  --   --  103  CO2 21*  --  21*  --  24  --   --  27  GLUCOSE 186*  --  224*  --  171*  --   --  137*  BUN 102*  --  110*  --  94*  --   --  56*  CREATININE 2.85*  --  3.14*  --  2.78*  --   --  2.21*  CALCIUM 9.2  --  9.4  --  8.1*  --   --  7.8*  PHOS 8.6*  --   --   --  6.7*  --   --  3.9   < > = values in this interval not displayed.   Liver Function Tests: Recent Labs  Lab 03/20/20  0500 03/21/20 0119 03/21/20 1558 04/13/2020 0454  AST 380* 93*  --  44*  ALT 311* 183*  --  124*  ALKPHOS 210* 156*  --  136*  BILITOT 3.0* 1.4*  --  0.9  PROT 6.3* 5.4*  --  5.6*  ALBUMIN 2.3* 1.9* 1.7* 1.9*   No results for input(s): LIPASE, AMYLASE in the last 168 hours. No results for input(s): AMMONIA in the last 168 hours. CBC: Recent Labs  Lab 03/16/20 0432 03/17/20 0624 03/18/20 1226 03/19/20 0601 03/20/20 0500 03/20/20 1428 03/20/20 2307 03/21/20 0139 03/21/20 0733 03/21/20 2359 04-13-20 0441  WBC 7.9 9.4   < > 17.1* 20.3*  --  15.2*  --  13.9* 14.1*  --   NEUTROABS 6.6 8.0*  --   --   --   --   --   --  12.7*  --   --   HGB 13.3 13.8   < > 12.9* 14.8   < > 12.5*   < > 11.5* 11.0* 11.2*  HCT 41.0 40.7   < > 39.5 45.7   < > 41.1   < > 39.4 36.2* 33.0*  MCV 91.5 91.7   < > 92.9 94.2  --  101.2*  --  101.5* 97.6  --   PLT 193 134*   < > 153 221  --  148*  --  138* 125*  --    < > = values in this interval not displayed.   Cardiac Enzymes: No results for input(s): CKTOTAL, CKMB, CKMBINDEX, TROPONINI in the last 168 hours. CBG: Recent Labs  Lab 03/21/20 1145 03/21/20 1609 03/21/20 2330 Apr 13, 2020 0324 04-13-2020 0805  GLUCAP 137* 149* 177* 137* 115*    Iron Studies: No results  for input(s): IRON, TIBC, TRANSFERRIN, FERRITIN in the last 72 hours. Studies/Results: CT ABDOMEN PELVIS WO CONTRAST  Result Date: 03/21/2020 CLINICAL DATA:  Acute renal failure. EXAM: CT ABDOMEN AND PELVIS WITHOUT CONTRAST TECHNIQUE: Multidetector CT imaging of the abdomen and pelvis was performed following the standard protocol without IV contrast. COMPARISON:  03/11/2020 FINDINGS: Lower chest: The diffuse interstitial and alveolar ground-glass opacity seen previously shows progression in the dependent lung bases with more confluent consolidative opacity today. Hepatobiliary: No focal abnormality in the liver on this study without intravenous contrast. There is no evidence for gallstones, gallbladder wall thickening, or pericholecystic fluid. No intrahepatic or extrahepatic biliary dilation. Pancreas: No focal mass lesion. No dilatation of the main duct. No intraparenchymal cyst. No peripancreatic edema. Spleen: No splenomegaly. No focal mass lesion. Adrenals/Urinary Tract: No adrenal nodule or mass. Abnormal appearance of right kidney is stable in the interval. Left kidney unremarkable. No hydroureteronephrosis. Bladder decompressed by Foley catheter. Stomach/Bowel: Stomach is nondistended. Feeding catheter is identified with tip transpyloric in the second portion of the duodenum. No small bowel wall thickening. No small bowel dilatation. The terminal ileum is normal. The appendix is not visualized, but there is no edema or inflammation in the region of the cecum. Diverticular changes are most prominent in the left colon with interval development of focal wall thickening in the mid sigmoid segment associated with pericolonic edema/inflammation. Imaging features are most suggestive of diverticulitis. There is some edema in the sigmoid mesocolon without rim enhancing/organized abscess at this time. Tiny gas bubbles seen along the posterior wall of the sigmoid colon on image 75/3 are probably in folds or  diverticuli, although contained micro perforation not entirely excluded. Vascular/Lymphatic: There is abdominal aortic atherosclerosis without aneurysm. There is no gastrohepatic  or hepatoduodenal ligament lymphadenopathy. No retroperitoneal or mesenteric lymphadenopathy. No pelvic sidewall lymphadenopathy. Reproductive: The prostate gland and seminal vesicles are unremarkable. Other: No intraperitoneal free fluid. Musculoskeletal: No worrisome lytic or sclerotic osseous abnormality. IMPRESSION: 1. Interval development of focal wall thickening in the mid sigmoid colon with pericolonic edema/inflammation. Imaging features are most suggestive of diverticulitis. Tiny gas bubbles seen along the posterior wall of the sigmoid colon are probably intraluminal although contained micro perforation not entirely excluded. No evidence for abscess. 2. Interval progression of diffuse interstitial and alveolar ground-glass opacity seen previously with more confluent consolidative opacity today. Imaging features are compatible with worsening edema or pneumonia. 3. Stable abnormal masslike distortion of the right kidney. This may be related to subcapsular hematoma although renal cell carcinoma could certainly have this appearance. Close follow-up recommended. 4. No evidence for hydroureteronephrosis in this patient with worsening renal failure. 5. Aortic Atherosclerosis (ICD10-I70.0). Electronically Signed   By: Misty Stanley M.D.   On: 03/21/2020 05:57   DG Abd 1 View  Result Date: 03/20/2020 CLINICAL DATA:  Enteric tube placement EXAM: ABDOMEN - 1 VIEW COMPARISON:  None. FINDINGS: The enteric tube tip projects over the gastric antrum/pylorus. The tip is pointed distally. The visualized bowel gas pattern is nonobstructive. IMPRESSION: Enteric tube projects over the gastric antrum/pylorus. Electronically Signed   By: Constance Holster M.D.   On: 03/20/2020 17:36   MR ABDOMEN W WO CONTRAST  Result Date: 03/21/2020 CLINICAL  DATA:  Renal mass, renal insufficiency, concern for hematoma EXAM: MRI ABDOMEN WITHOUT AND WITH CONTRAST TECHNIQUE: Multiplanar multisequence MR imaging of the abdomen was performed both before and after the administration of intravenous contrast. CONTRAST:  38mL GADAVIST GADOBUTROL 1 MMOL/ML IV SOLN COMPARISON:  CT abdomen pelvis, 03/21/2020, 03/19/2020 FINDINGS: Lower chest: Bibasilar heterogeneous airspace disease. Hepatobiliary: There are multiple, ill-defined transient peripheral subcapsular hyperenhancing foci of the liver parenchyma (series 18, image 80 123). No definite mass or other parenchymal abnormality identified. Pancreas: No mass, inflammatory changes, or other parenchymal abnormality identified. Spleen:  Within normal limits in size and appearance. Adrenals/Urinary Tract: There is a large mass with heterogeneous intrinsic signal and extensive, heterogeneous contrast enhancement about the periphery of the right kidney, which although unusual and somewhat perinephric in appearance clearly violate the normal contour of the renal cortex and demonstrates a much greater degree of contrast enhancement thancould ascribed to a perinephric hematoma (series 22, image 38, series 24, image 33). Suspect direct involvement of the adjacent right adrenal gland and inferior vena cava (series 22, image 30). Overall dimensions of the mass, although difficult to distinguish from the normal kidney, are approximately 13.5 x 11.4 x 6.9 cm. The left kidney and adrenal gland are normal no evidence of hydronephrosis. Stomach/Bowel: Inflammatory findings about the sigmoid colon are not well imaged, seen only on select coronal images (series 24, image 51). Vascular/Lymphatic: No pathologically enlarged lymph nodes identified. Aortic atherosclerosis. No abdominal aortic aneurysm demonstrated. Other:  None. Musculoskeletal: There are numerous abnormally contrast enhancing lesions noted throughout the included spine and pelvis (e.g.  Series 24, image 31, 34). IMPRESSION: 1. Large mass with heterogeneous intrinsic signal and extensive, heterogeneous contrast enhancement about the periphery of the right kidney, which although unusual and somewhat perinephric in appearance clearly violates the normal contour of the renal cortex and demonstrates a much greater degree of contrast enhancement density compatible with a bland perinephric hematoma. Findings are consistent with a large renal cell carcinoma, although difficult to accurately measure approximately 13.5 x 11.4 x 6.9  cm. Suspect direct involvement of the adjacent right adrenal gland and inferior vena cava. 2. Numerous abnormally contrast enhancing lesions throughout the included spine and pelvis, concerning for widespread osseous metastatic disease. 3. There are multiple, ill-defined transient peripheral subcapsular hyperenhancing foci of the liver parenchyma. There is no definite mass or other parenchymal abnormality identified. These most likely reflect transient hepatic intensity differences. Hepatic metastatic disease is unusual in renal cell carcinoma and not favored. 4. No lymphadenopathy in the abdomen. 5. Inflammatory findings about the sigmoid colon are not well imaged, seen only on select coronal images and better assessed by same-day CT. Aortic Atherosclerosis (ICD10-I70.0). Electronically Signed   By: Eddie Candle M.D.   On: 03/21/2020 11:58   DG Chest Port 1 View  Result Date: 03/21/2020 CLINICAL DATA:  Central line placement EXAM: PORTABLE CHEST 1 VIEW COMPARISON:  Portable exam 1305 hours compared to 0518 hours FINDINGS: Tip of endotracheal tube projects 1.2 cm above carina, recommend withdrawal 1-2 cm. RIGHT jugular central venous catheter with tip projecting over SVC. Normal heart size, mediastinal contours, and pulmonary vascularity. Persistent bibasilar infiltrates persist. No pleural effusion or pneumothorax. Osseous structures unremarkable. IMPRESSION: Persistent  bibasilar infiltrates. No pneumothorax following RIGHT jugular line placement. Recommend withdrawal of endotracheal tube 1-2 cm. Findings called to patient's nurse RN in MICU on 03/21/2020 at 1313 hours. Electronically Signed   By: Lavonia Dana M.D.   On: 03/21/2020 13:17   DG CHEST PORT 1 VIEW  Result Date: 03/21/2020 CLINICAL DATA:  Endotracheal tube EXAM: PORTABLE CHEST 1 VIEW COMPARISON:  Yesterday FINDINGS: Endotracheal tube with tip halfway between the clavicular heads and carina. Feeding tube that at least reaches the stomach. Bilateral airspace disease. Hyperinflation and emphysema. No visible effusion or pneumothorax. Normal heart size. IMPRESSION: 1. Stable hardware positioning. 2. Stable pneumonia superimposed on emphysema. Electronically Signed   By: Monte Fantasia M.D.   On: 03/21/2020 07:12   DG CHEST PORT 1 VIEW  Result Date: 03/20/2020 CLINICAL DATA:  Intubation EXAM: PORTABLE CHEST 1 VIEW COMPARISON:  Chest x-ray 12242 5:26 a.m. FINDINGS: Interval placement of endotracheal tube with tip terminating 7 cm above the carina. An enteric tube is noted to course below the diaphragm with tip collimated off view. The guidewire is noted still present within the enteric tube. The heart size and mediastinal contours are within normal limits. Persistent bilateral patchy airspace opacities. No pulmonary edema. No pleural effusion. No pneumothorax. No acute osseous abnormality. IMPRESSION: 1. Interval placement of an endotracheal tube with tip 7 cm above the carina. Consider advancing by 1-2 cm. 2. Similar appearing multifocal pneumonia. Electronically Signed   By: Iven Finn M.D.   On: 03/20/2020 17:37   Medications: Infusions: .  prismasol BGK 4/2.5 500 mL/hr at 2020/04/21 0753  .  prismasol BGK 4/2.5 300 mL/hr at 03/21/20 2128  . sodium chloride 10 mL/hr at 03/21/20 0400  . ceFEPime (MAXIPIME) IV 2 g (April 21, 2020 0200)  . dexmedetomidine (PRECEDEX) IV infusion 0.8 mcg/kg/hr (Apr 21, 2020 0900)  .  dextrose 75 mL/hr at 21-Apr-2020 0933  . fentaNYL infusion INTRAVENOUS 200 mcg/hr (04-21-2020 0900)  . heparin 1,500 Units/hr (21-Apr-2020 0944)  . metronidazole 500 mg (Apr 21, 2020 0929)  . norepinephrine (LEVOPHED) Adult infusion 4 mcg/min (April 21, 2020 0900)  . prismasol BGK 4/2.5 1,500 mL/hr at 04/21/2020 0701  . sodium bicarbonate (isotonic) 150 mEq in D5W 1000 mL infusion 125 mL/hr at April 21, 2020 0900    Scheduled Medications: . albuterol  2.5 mg Nebulization Q4H  . chlorhexidine gluconate (MEDLINE KIT)  15 mL Mouth Rinse BID  . Chlorhexidine Gluconate Cloth  6 each Topical Daily  . famotidine  10 mg Per Tube Daily  . mouth rinse  15 mL Mouth Rinse 10 times per day  . sodium chloride flush  10-40 mL Intracatheter Q12H  . sodium chloride flush  10-40 mL Intracatheter Q12H  . sodium chloride flush  3 mL Intravenous Q12H    have reviewed scheduled and prn medications.  Physical Exam: PE not done due to covid precautions Dialysis Access: vascath placed 12/25     04/09/2020,10:04 AM  LOS: 9 days

## 2020-03-28 DEATH — deceased

## 2022-04-05 IMAGING — DX DG CHEST 1V PORT
1 series · 1 of 1 positions shown · non-contrast
Comparison: None.

CLINICAL DATA: Shortness of breath.

EXAM:
PORTABLE CHEST 1 VIEW

[chest ap]
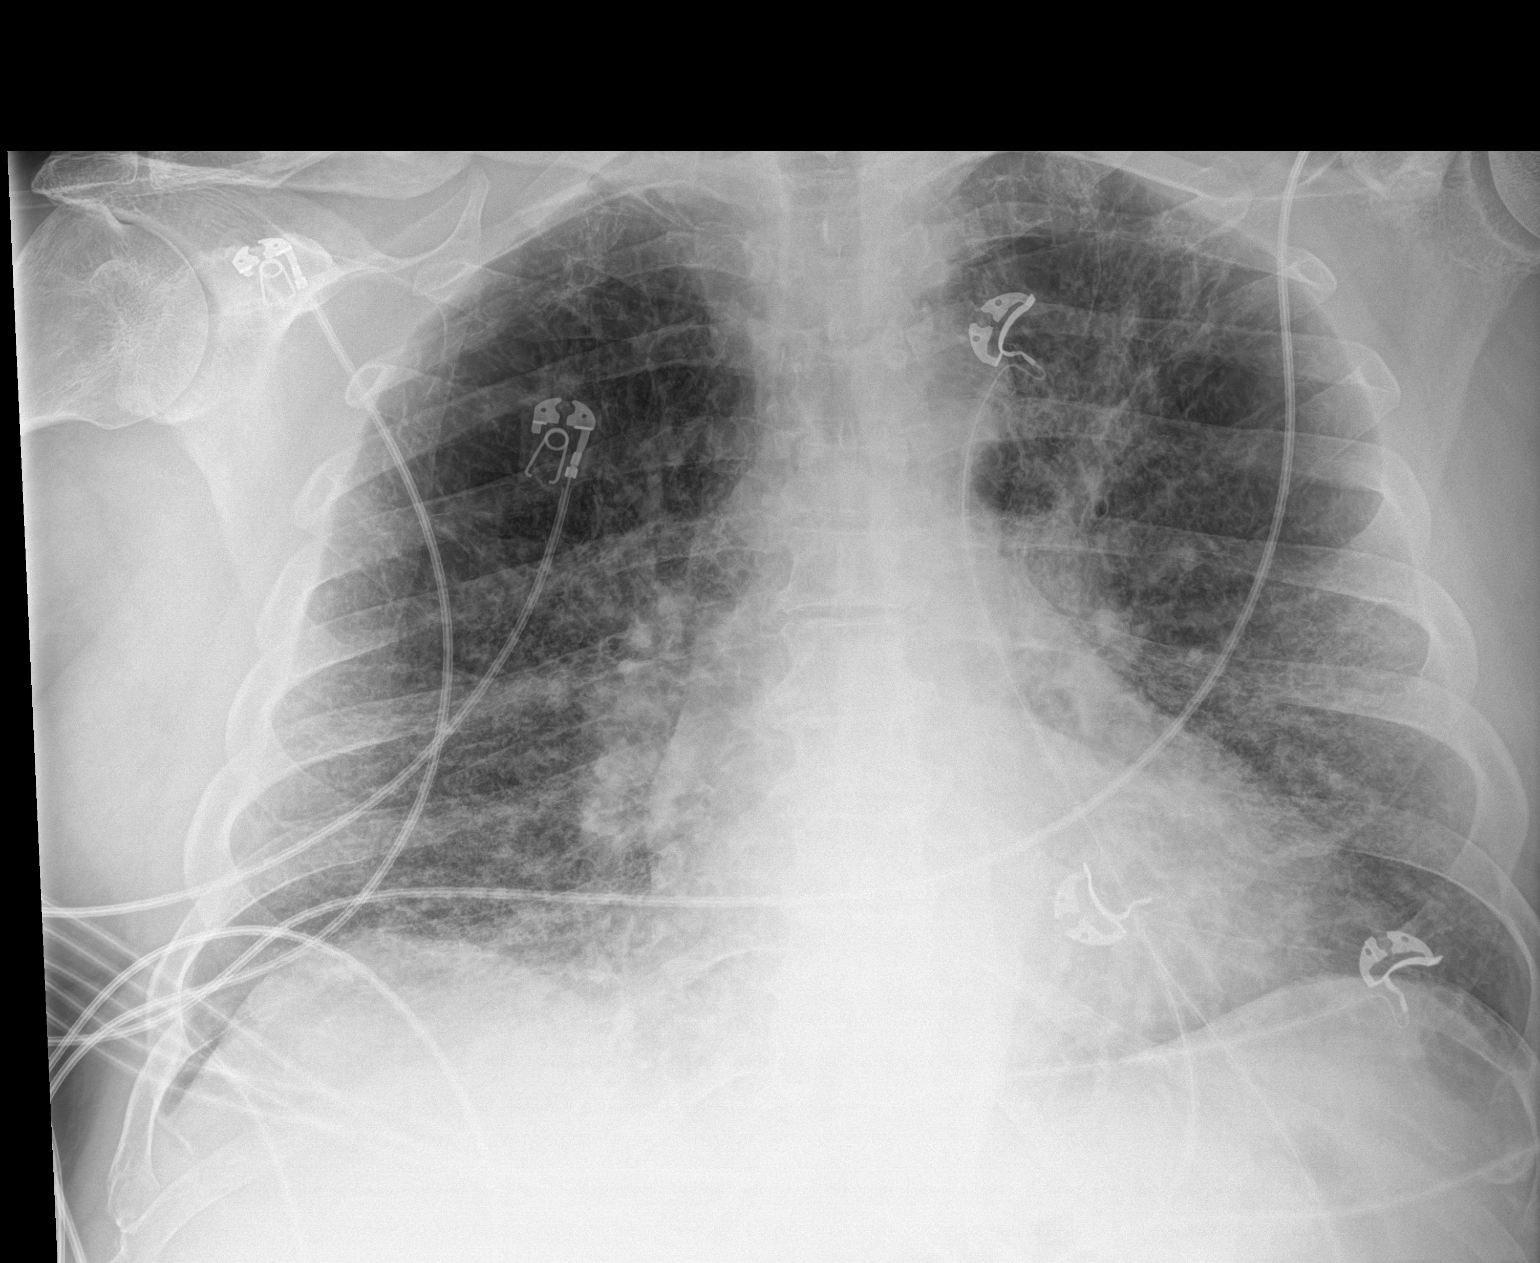

[1 of 1 positions shown; findings below may reference images not displayed]

FINDINGS: The heart size and mediastinal contours are within normal limits.
Diffuse interstitial thickening. More ill-defined perihilar airspace
opacity in the left upper lobe. No pleural effusion or pneumothorax.
No acute osseous abnormality.
IMPRESSION: 1. Left upper lobe airspace disease concerning for pneumonia.
2. Bilateral diffuse interstitial thickening may represent
interstitial edema versus interstitial pneumonitis secondary to an
infectious or inflammatory etiology.
